# Patient Record
Sex: Female | Born: 1935 | Race: White | Hispanic: No | Smoking: Never smoker
Health system: Southern US, Community
[De-identification: ages and names within clinical notes are randomized; demographics above are authoritative.]

## PROBLEM LIST (undated history)

## (undated) DIAGNOSIS — I10 Essential (primary) hypertension: Secondary | ICD-10-CM

## (undated) DIAGNOSIS — I5022 Chronic systolic (congestive) heart failure: Secondary | ICD-10-CM

## (undated) HISTORY — PX: OTHER SURGICAL HISTORY: SHX169

---

## 2007-02-08 ENCOUNTER — Inpatient Hospital Stay: Payer: Self-pay | Admitting: Unknown Physician Specialty

## 2007-02-08 ENCOUNTER — Other Ambulatory Visit: Payer: Self-pay

## 2018-07-02 ENCOUNTER — Ambulatory Visit: Payer: Self-pay | Admitting: Family Medicine

## 2019-06-27 ENCOUNTER — Inpatient Hospital Stay
Admission: EM | Admit: 2019-06-27 | Discharge: 2019-06-30 | DRG: 291 | Disposition: A | Discharge status: Home Health | Payer: Medicare Other | Attending: Internal Medicine | Admitting: Internal Medicine

## 2019-06-27 ENCOUNTER — Other Ambulatory Visit: Payer: Self-pay

## 2019-06-27 ENCOUNTER — Emergency Department: Payer: Medicare Other

## 2019-06-27 DIAGNOSIS — J849 Interstitial pulmonary disease, unspecified: Secondary | ICD-10-CM | POA: Diagnosis present

## 2019-06-27 DIAGNOSIS — I5042 Chronic combined systolic (congestive) and diastolic (congestive) heart failure: Secondary | ICD-10-CM

## 2019-06-27 DIAGNOSIS — R0602 Shortness of breath: Secondary | ICD-10-CM | POA: Diagnosis not present

## 2019-06-27 DIAGNOSIS — I248 Other forms of acute ischemic heart disease: Secondary | ICD-10-CM | POA: Diagnosis present

## 2019-06-27 DIAGNOSIS — Z20828 Contact with and (suspected) exposure to other viral communicable diseases: Secondary | ICD-10-CM | POA: Diagnosis present

## 2019-06-27 DIAGNOSIS — Z7982 Long term (current) use of aspirin: Secondary | ICD-10-CM

## 2019-06-27 DIAGNOSIS — J441 Chronic obstructive pulmonary disease with (acute) exacerbation: Secondary | ICD-10-CM | POA: Diagnosis present

## 2019-06-27 DIAGNOSIS — J9601 Acute respiratory failure with hypoxia: Secondary | ICD-10-CM | POA: Diagnosis present

## 2019-06-27 DIAGNOSIS — R739 Hyperglycemia, unspecified: Secondary | ICD-10-CM | POA: Diagnosis not present

## 2019-06-27 DIAGNOSIS — Z66 Do not resuscitate: Secondary | ICD-10-CM | POA: Diagnosis not present

## 2019-06-27 DIAGNOSIS — I509 Heart failure, unspecified: Secondary | ICD-10-CM

## 2019-06-27 DIAGNOSIS — I5021 Acute systolic (congestive) heart failure: Principal | ICD-10-CM | POA: Diagnosis present

## 2019-06-27 DIAGNOSIS — R0603 Acute respiratory distress: Secondary | ICD-10-CM

## 2019-06-27 LAB — BASIC METABOLIC PANEL
Anion gap: 10 (ref 5–15)
BUN: 17 mg/dL (ref 8–23)
CO2: 27 mmol/L (ref 22–32)
Calcium: 8.9 mg/dL (ref 8.9–10.3)
Chloride: 101 mmol/L (ref 98–111)
Creatinine, Ser: 0.73 mg/dL (ref 0.44–1.00)
GFR calc Af Amer: >60 mL/min (ref >60)
GFR calc non Af Amer: >60 mL/min (ref >60)
Glucose, Bld: 218 mg/dL — ABNORMAL HIGH (ref 70–99)
Potassium: 4.8 mmol/L (ref 3.5–5.1)
Sodium: 138 mmol/L (ref 135–145)

## 2019-06-27 LAB — CBC WITH DIFFERENTIAL/PLATELET
Abs Immature Granulocytes: 0.04 10*3/uL (ref 0.00–0.07)
Basophils Absolute: 0 10*3/uL (ref 0.0–0.1)
Basophils Relative: 0 %
Eosinophils Absolute: 0.1 10*3/uL (ref 0.0–0.5)
Eosinophils Relative: 1 %
HCT: 43.9 % (ref 36.0–46.0)
Hemoglobin: 14.2 g/dL (ref 12.0–15.0)
Immature Granulocytes: 0 %
Lymphocytes Relative: 47 %
Lymphs Abs: 4.6 10*3/uL — ABNORMAL HIGH (ref 0.7–4.0)
MCH: 30.2 pg (ref 26.0–34.0)
MCHC: 32.3 g/dL (ref 30.0–36.0)
MCV: 93.4 fL (ref 80.0–100.0)
Monocytes Absolute: 0.7 10*3/uL (ref 0.1–1.0)
Monocytes Relative: 7 %
Neutro Abs: 4.4 10*3/uL (ref 1.7–7.7)
Neutrophils Relative %: 45 %
Platelets: 231 10*3/uL (ref 150–400)
RBC: 4.7 MIL/uL (ref 3.87–5.11)
RDW: 13.5 % (ref 11.5–15.5)
WBC: 9.8 10*3/uL (ref 4.0–10.5)
nRBC: 0 % (ref 0.0–0.2)

## 2019-06-27 LAB — BLOOD GAS, VENOUS
Acid-Base Excess: 0.2 mmol/L (ref 0.0–2.0)
Bicarbonate: 26.9 mmol/L (ref 20.0–28.0)
O2 Saturation: 82.8 %
Patient temperature: 37
pCO2, Ven: 51 mmHg (ref 44.0–60.0)
pH, Ven: 7.33 (ref 7.250–7.430)
pO2, Ven: 51 mmHg — ABNORMAL HIGH (ref 32.0–45.0)

## 2019-06-27 LAB — SARS CORONAVIRUS 2 BY RT PCR (HOSPITAL ORDER, PERFORMED IN ~~LOC~~ HOSPITAL LAB): SARS Coronavirus 2: NEGATIVE

## 2019-06-27 LAB — TROPONIN I (HIGH SENSITIVITY): Troponin I (High Sensitivity): 36 ng/L — ABNORMAL HIGH (ref <18)

## 2019-06-27 LAB — BRAIN NATRIURETIC PEPTIDE: B Natriuretic Peptide: 1992 pg/mL — ABNORMAL HIGH (ref 0.0–100.0)

## 2019-06-27 MED ORDER — FUROSEMIDE 10 MG/ML IJ SOLN
40.0000 mg | Freq: Two times a day (BID) | INTRAMUSCULAR | Status: DC
  Administered 2019-06-28 – 2019-06-29 (×3): 40 mg via INTRAVENOUS
  Filled 2019-06-27 (×3): qty 4

## 2019-06-27 MED ORDER — ACETAMINOPHEN 325 MG PO TABS: 650.0000 mg | ORAL_TABLET | ORAL | Status: DC | PRN

## 2019-06-27 MED ORDER — SODIUM CHLORIDE 0.9% FLUSH
3.0000 mL | Freq: Two times a day (BID) | INTRAVENOUS | Status: DC
  Administered 2019-06-28 – 2019-06-30 (×6): 3 mL via INTRAVENOUS

## 2019-06-27 MED ORDER — NITROGLYCERIN IN D5W 200-5 MCG/ML-% IV SOLN
0.0000 ug/min | INTRAVENOUS | Status: DC
  Administered 2019-06-27: 11:00:00 5 ug/min via INTRAVENOUS
  Filled 2019-06-27: qty 250

## 2019-06-27 MED ORDER — LEVOFLOXACIN IN D5W 750 MG/150ML IV SOLN
750.0000 mg | Freq: Once | INTRAVENOUS | Status: AC
  Administered 2019-06-27: 12:00:00 750 mg via INTRAVENOUS
  Filled 2019-06-27: qty 150

## 2019-06-27 MED ORDER — ONDANSETRON HCL 4 MG/2ML IJ SOLN: 4.0000 mg | Freq: Four times a day (QID) | INTRAMUSCULAR | Status: DC | PRN

## 2019-06-27 MED ORDER — CRANBERRY 400 MG PO CAPS: 400.0000 mg | ORAL_CAPSULE | Freq: Every day | ORAL | Status: DC

## 2019-06-27 MED ORDER — ASPIRIN EC 81 MG PO TBEC
81.0000 mg | DELAYED_RELEASE_TABLET | Freq: Every day | ORAL | Status: DC
  Administered 2019-06-28 – 2019-06-30 (×3): 81 mg via ORAL
  Filled 2019-06-27 (×3): qty 1

## 2019-06-27 MED ORDER — SODIUM CHLORIDE 0.9 % IV SOLN
500.0000 mg | INTRAVENOUS | Status: DC
  Administered 2019-06-28: 09:00:00 500 mg via INTRAVENOUS
  Filled 2019-06-27 (×2): qty 500

## 2019-06-27 MED ORDER — ENOXAPARIN SODIUM 40 MG/0.4ML ~~LOC~~ SOLN
30.0000 mg | SUBCUTANEOUS | Status: DC
  Administered 2019-06-28 – 2019-06-29 (×2): 30 mg via SUBCUTANEOUS
  Filled 2019-06-27 (×2): qty 0.4

## 2019-06-27 MED ORDER — SODIUM CHLORIDE 0.9 % IV SOLN
250.0000 mL | INTRAVENOUS | Status: DC | PRN
  Administered 2019-06-29: 10:00:00 250 mL via INTRAVENOUS

## 2019-06-27 MED ORDER — SODIUM CHLORIDE 0.9% FLUSH: 3.0000 mL | INTRAVENOUS | Status: DC | PRN

## 2019-06-27 MED ORDER — FUROSEMIDE 10 MG/ML IJ SOLN
40.0000 mg | Freq: Once | INTRAMUSCULAR | Status: AC
  Administered 2019-06-27: 11:00:00 40 mg via INTRAVENOUS
  Filled 2019-06-27: qty 4

## 2019-06-27 MED ORDER — ADULT MULTIVITAMIN W/MINERALS CH
1.0000 | ORAL_TABLET | Freq: Every day | ORAL | Status: DC
  Administered 2019-06-28 – 2019-06-30 (×3): 1 via ORAL
  Filled 2019-06-27 (×3): qty 1

## 2019-06-27 MED ORDER — SODIUM CHLORIDE 0.9 % IV SOLN
1.0000 g | INTRAVENOUS | Status: DC
  Administered 2019-06-28 – 2019-06-29 (×2): 1 g via INTRAVENOUS
  Filled 2019-06-27 (×2): qty 1

## 2019-06-27 MED ORDER — CHLORHEXIDINE GLUCONATE CLOTH 2 % EX PADS: 6.0000 | MEDICATED_PAD | Freq: Every day | CUTANEOUS | Status: DC

## 2019-06-27 MED ORDER — LOSARTAN POTASSIUM 25 MG PO TABS
25.0000 mg | ORAL_TABLET | Freq: Every day | ORAL | Status: DC
  Administered 2019-06-28 – 2019-06-30 (×3): 25 mg via ORAL
  Filled 2019-06-27 (×3): qty 1

## 2019-06-27 NOTE — ED Notes (Signed)
Swabbed pt's mouth with glycerin swab

## 2019-06-27 NOTE — H&P (Addendum)
[facility] [place] [facility]   PATIENT NAME: Patricia Bradford    MR#:  [id number]  DATE OF BIRTH:  [birth year]  DATE OF ADMISSION:  06/27/2019  PRIMARY CARE PHYSICIAN: System, Pcp Not In   REQUESTING/REFERRING PHYSICIAN: Daryel November  CHIEF COMPLAINT:   Chief Complaint  Patient presents with  . Respiratory Distress    HISTORY OF PRESENT ILLNESS:  Patricia Bradford  is a 85 y.o. female with a known history of no significant past medical history who was brought into the emergency room with complaints of shortness of breath and patient was in respiratory distress.  Patient reported to have received flu shot about 2 days prior.  Arrived in the ED in respiratory distress and was placed on BiPAP due to patient being tachypneic with bilateral rales on exam.  She also was given topical nitroglycerin in route due to elevated blood pressure.  Patient denied any chest pain.  Patient has decreased hearing but still able to respond to questions.  No fevers.  No cough.  Patient still requiring BiPAP at this time.  ABG done was venous blood gas with pH of 7.33 with PCO2 of 51.  PO2 of 51 with bicarb of 26.9.  Noted mild elevation in troponin of 36 B-type natriuretic peptide elevated at 1922.  Chest x-ray done revealed diffuse interstitial prominence throughout the lungs, favor chronic interstitial lung disease. Patchy bilateral opacities are concerning for superimposed pneumonia.  Based on clinical presentation patient was diagnosed with new CHF exacerbation rule out pneumonia.  Patient given a dose of levofloxacin in the emergency room.  Medical service called to admit patient for further evaluation and management.  PAST MEDICAL HISTORY:  Unremarkable  PAST SURGICAL HISTORY:  Unremarkable  SOCIAL HISTORY:   Social History   Tobacco Use  . Smoking status: Never Smoker  . Smokeless tobacco: Never Used  Substance Use Topics  . Alcohol use: Never    Frequency: Never     FAMILY HISTORY:  No family history of diabetes mellitus  DRUG ALLERGIES:  No Known Allergies  REVIEW OF SYSTEMS:   Review of Systems  Constitutional: Negative for chills and fever.  HENT: Negative for hearing loss and tinnitus.   Eyes: Negative for blurred vision and double vision.  Respiratory: Positive for shortness of breath. Negative for hemoptysis.   Cardiovascular: Negative for chest pain, palpitations and leg swelling.  Gastrointestinal: Negative for heartburn, nausea and vomiting.  Genitourinary: Negative for dysuria.  Musculoskeletal: Negative for myalgias.  Skin: Negative for itching and rash.  Psychiatric/Behavioral: Negative for depression and hallucinations.    MEDICATIONS AT HOME:   Prior to Admission medications   Medication Sig Start Date End Date Taking? Authorizing Provider  aspirin EC 81 MG tablet Take 81 mg by mouth daily.   Yes [provider]  Cranberry 400 MG CAPS Take 400 mg by mouth daily.   Yes [provider]  Multiple Vitamin (MULTI-VITAMIN) tablet Take 1 tablet by mouth daily.   Yes [provider]      VITAL SIGNS:  Blood pressure 110/65, pulse 97, temperature 98 F (36.7 C), temperature source Axillary, resp. rate (!) 30, height 4\' 10"  (1.473 m), weight 49.9 kg, SpO2 99 %.  PHYSICAL EXAMINATION:  Physical Exam  GENERAL:  85 y.o.-year-old patient lying in the bed with no acute distress.  EYES: Pupils equal, round, reactive to light and accommodation. No scleral icterus. Extraocular muscles intact.  HEENT: Head atraumatic, normocephalic. Oropharynx and nasopharynx clear.  Patient on BiPAP NECK:  Supple, no jugular venous distention. No thyroid enlargement, no tenderness.  LUNGS: Rales bilaterally, no wheezing, rales,rhonchi or crepitation.  Positive for use of accessory muscles of respiration.  CARDIOVASCULAR: S1, S2 normal. No murmurs, rubs, or gallops.  ABDOMEN: Soft, nontender, nondistended. Bowel sounds present.  No organomegaly or mass.  EXTREMITIES: No pedal edema, cyanosis, or clubbing.  NEUROLOGIC: Generalized weakness but moving all extremities with no focal deficit.  Sensation intact. Gait not checked.  PSYCHIATRIC: The patient is alert and oriented x 3.  SKIN: No obvious rash, lesion, or ulcer.   LABORATORY PANEL:   CBC Recent Labs  Lab 06/27/19 1043  WBC 9.8  HGB 14.2  HCT 43.9  PLT 231   ------------------------------------------------------------------------------------------------------------------  Chemistries  Recent Labs  Lab 06/27/19 1043  NA 138  K 4.8  CL 101  CO2 27  GLUCOSE 218*  BUN 17  CREATININE 0.73  CALCIUM 8.9   ------------------------------------------------------------------------------------------------------------------  Cardiac Enzymes No results for input(s): TROPONINI in the last 168 hours. ------------------------------------------------------------------------------------------------------------------  RADIOLOGY:  Dg Chest Port 1 View  Result Date: 06/27/2019 CLINICAL DATA:  Shortness of breath EXAM: PORTABLE CHEST 1 VIEW COMPARISON:  None. FINDINGS: Heart is normal size. Patchy bilateral airspace opacities are noted. This is superimposed on diffuse interstitial prominence. No effusions. No acute bony abnormality. IMPRESSION: Diffuse interstitial prominence throughout the lungs, favor chronic interstitial lung disease. Patchy bilateral opacities are concerning for superimposed pneumonia. Electronically Signed   By: Rolm Baptise M.D.   On: 06/27/2019 11:19      IMPRESSION AND PLAN:  Patient is a 85 year old female with no significant past medical history being admitted with acute respiratory distress secondary to newly diagnosed CHF with acute exacerbation rule out superimposed pneumonia  1.  Acute hypoxic respiratory failure Secondary to newly diagnosed CHF exacerbation. Patient was initially hypoxic and tachypneic requiring BiPAP in the  emergency room. Noted to have elevated B-type natriuretic peptide.  Rales on exam.  Chest x-ray findings most suggestive of CHF but could not rule out superimposed pneumonia. Patient already being diuresed with IV Lasix 40 mg every 12 hourly.  Started on losartan. Would benefit from initiation of beta-blocker prior to discharge from the hospital.  Follow-up on cardiac enzymes. 2D echocardiogram to evaluate cardiac function. Cardiology consult placed. Blood cultures were also drawn and patient given a dose of Levaquin in the emergency room.  Given patient's age we will switch to azithromycin and Rocephin to cover for any possible superimposed community-acquired pneumonia. Wean off BiPAP as tolerated. Mildly elevated troponin at 36.  Likely due to demand ischemia.  Follow-up on repeat troponin level. Covid test negative  DVT prophylaxis; Lovenox.   All the records are reviewed and case discussed with ED provider. No family member present at this time.  I attempted calling patient's daughter listed in the chart Ms Gar Gibbon but no response.  Will update family when available.    CODE STATUS: Full code  TOTAL TIME TAKING CARE OF THIS PATIENT: 62 minutes.    Durrel Mcnee M.D on 06/27/2019 at 2:17 PM  Between 7am to 6pm - Pager - [phone]  After 6pm go to [email or web address] - Proofreader  [facility] [place] [facility]  Office  [phone]  CC: Primary care physician; System, Pcp Not In   Note: This dictation was prepared with Dragon dictation along with smaller phrase technology. Any transcriptional errors that result from this process are unintentional.

## 2019-06-27 NOTE — ED Notes (Signed)
Pt given socks and update given to family

## 2019-06-27 NOTE — ED Notes (Signed)
Pts family given update  

## 2019-06-27 NOTE — ED Notes (Signed)
Pt complaining of tenderness around R wrist. Assessed IV site and noted significant dried blood around and under dressing. Dressing removed and small skin tear noted around IV insertion site. Saline drained from tear when IV flushed. IV discontinued. Site cleaned and dressed with telfa dressing and kerlex bandage. Pt tolerated wound care well.

## 2019-06-27 NOTE — ED Provider Notes (Signed)
[facility] Emergency Department Provider Note       Time seen: ----------------------------------------- 10:56 AM on 06/27/2019 ----------------------------------------- Level V caveat: History/ROS limited by respiratory distress  I have reviewed the triage vital signs and the nursing notes.  HISTORY   Chief Complaint Respiratory Distress    HPI Patricia Bradford is a 85 y.o. female with no known past medical history who presents to the ED for shortness of breath that started this morning.  Patient reportedly received his flu shot 2 days ago.  She arrives in respiratory distress, was placed on CPAP and thought to have rales bilaterally.  She was also given topical nitroglycerin for hypertension and presumed CHF in route.  She cannot give any further review of systems or report.  History reviewed. No pertinent past medical history.  There are no active problems to display for this patient.   History reviewed. No pertinent surgical history.  Allergies Patient has no known allergies.  Social History Social History   Tobacco Use  . Smoking status: Never Smoker  . Smokeless tobacco: Never Used  Substance Use Topics  . Alcohol use: Never    Frequency: Never  . Drug use: Never   Review of Systems Unknown, reported shortness of breath  All systems negative/normal/unremarkable except as stated in the HPI  ____________________________________________   PHYSICAL EXAM:  VITAL SIGNS: ED Triage Vitals  Enc Vitals Group     BP 06/27/19 1048 (!) 174/87     Pulse Rate 06/27/19 1048 (!) 125     Resp 06/27/19 1048 (!) 45     Temp 06/27/19 1048 98 F (36.7 C)     Temp Source 06/27/19 1048 Axillary     SpO2 06/27/19 1048 100 %     Weight 06/27/19 1044 110 lb (49.9 kg)     Height 06/27/19 1044 4\' 10"  (1.473 m)     Head Circumference --      Peak Flow --      Pain Score --      Pain Loc --      Pain Edu? --      Excl. in GC? --    Constitutional:  Alert, moderate to severe distress Eyes: Conjunctivae are normal. Normal extraocular movements. ENT      Head: Normocephalic and atraumatic.      Nose: No congestion/rhinnorhea.      Mouth/Throat: Mucous membranes are moist.      Neck: No stridor. Cardiovascular: Rapid rate, regular rhythm. No murmurs, rubs, or gallops. Respiratory: Tachypnea with rales bilaterally Gastrointestinal: Soft and nontender. Normal bowel sounds Musculoskeletal: Nontender with normal range of motion in extremities. No lower extremity tenderness nor edema. Neurologic:  No gross focal neurologic deficits are appreciated.  Skin:  Skin is warm, dry and intact. No rash noted. Psychiatric: Anxious mood and affect ____________________________________________  EKG: Interpreted by me.  Sinus tachycardia with a rate of 109 bpm, PACs, left bundle branch block, normal QT  ____________________________________________  ED COURSE:  As part of my medical decision making, I reviewed the following data within the electronic MEDICAL RECORD NUMBER History obtained from family if available, nursing notes, old chart and ekg, as well as notes from prior ED visits. Patient presented for respiratory distress, we will assess with labs and imaging as indicated at this time.   Procedures  Patricia Bradford was evaluated in Emergency Department on 06/27/2019 for the symptoms described in the history of present illness. She was evaluated in the context of the  global COVID-19 pandemic, which necessitated consideration that the patient might be at risk for infection with the SARS-CoV-2 virus that causes COVID-19. Institutional protocols and algorithms that pertain to the evaluation of patients at risk for COVID-19 are in a state of rapid change based on information released by regulatory bodies including the CDC and federal and state organizations. These policies and algorithms were followed during the patient's care in the ED.   ____________________________________________   LABS (pertinent positives/negatives)  Labs Reviewed  CBC WITH DIFFERENTIAL/PLATELET - Abnormal; Notable for the following components:      Result Value   Lymphs Abs 4.6 (*)    All other components within normal limits  BASIC METABOLIC PANEL - Abnormal; Notable for the following components:   Glucose, Bld 218 (*)    All other components within normal limits  BRAIN NATRIURETIC PEPTIDE - Abnormal; Notable for the following components:   B Natriuretic Peptide 1,992.0 (*)    All other components within normal limits  BLOOD GAS, VENOUS - Abnormal; Notable for the following components:   pO2, Ven 51.0 (*)    All other components within normal limits  TROPONIN I (HIGH SENSITIVITY) - Abnormal; Notable for the following components:   Troponin I (High Sensitivity) 36 (*)    All other components within normal limits  SARS CORONAVIRUS 2 BY RT PCR (HOSPITAL ORDER, PERFORMED IN [facility])  CULTURE, BLOOD (ROUTINE X 2)  CULTURE, BLOOD (ROUTINE X 2)   CRITICAL CARE Performed by: Ulice Dash   Total critical care time: 30 minutes  Critical care time was exclusive of separately billable procedures and treating other patients.  Critical care was necessary to treat or prevent imminent or life-threatening deterioration.  Critical care was time spent personally by me on the following activities: development of treatment plan with patient and/or surrogate as well as nursing, discussions with consultants, evaluation of patient's response to treatment, examination of patient, obtaining history from patient or surrogate, ordering and performing treatments and interventions, ordering and review of laboratory studies, ordering and review of radiographic studies, pulse oximetry and re-evaluation of patient's condition.  RADIOLOGY Images were viewed by me  Chest x-ray  IMPRESSION:  Diffuse interstitial prominence throughout the  lungs, favor chronic  interstitial lung disease. Patchy bilateral opacities are concerning  for superimposed pneumonia.  ____________________________________________   DIFFERENTIAL DIAGNOSIS   CHF, COPD, COVID-19, pneumonia, MI, PE  FINAL ASSESSMENT AND PLAN  Acute respiratory distress, congestive heart failure   Plan: The patient had presented for acute respiratory distress on CPAP.  We immediately changed over to BiPAP which she could tolerate better.  I did order some IV Lasix as well as a nitroglycerin drip.  Patient's labs are most indicative of congestive heart failure and as far as we can tell this would be new onset.  BNP was elevated and troponin is mildly elevated.  Left bundle branch block on EKG but it is unclear if this is new or old.  Patient's imaging revealed patchy bilateral opacities which to me likely indicate CHF given her clinical scenario.  She was also given Levaquin to cover for infection.  She remains on BiPAP at this time but we will try to wean.  I have discussed with the hospitalist for admission.   Ulice Dash, MD    Note: This note was generated in part or whole with voice recognition software. Voice recognition is usually quite accurate but there are transcription errors that can and very often do occur.  I apologize for any typographical errors that were not detected and corrected.     Earleen Newport, MD 06/27/19 1240

## 2019-06-27 NOTE — ED Triage Notes (Signed)
Pt arrived via ems from home with c/o [facility] that started this am - only change to medical hx was flu shot 2 days ago - pt arrives in respiratory distress on cpap - provider at bedside and RT called

## 2019-06-27 NOTE — ED Notes (Signed)
Per Larene Beach RN- pt family stated they did not want pt admitted- Dr Jimmye [facility] in room to speak with family

## 2019-06-27 NOTE — ED Notes (Signed)
Attempted to call report- was told that the charge nurse had not assigned this pt to a nurse yet

## 2019-06-27 NOTE — ED Notes (Signed)
Pt has on 2" ntg paste and was also given allopurinol 1.25mg  by EMS

## 2019-06-27 NOTE — ED Notes (Signed)
Pt BP remains 97/61

## 2019-06-27 NOTE — Progress Notes (Signed)
PHARMACIST - PHYSICIAN ORDER COMMUNICATION  CONCERNING: P&T Medication Policy on Herbal Medications  DESCRIPTION:  This patient's order for:  CRANBERRY  has been noted.  This product(s) is classified as an "herbal" or natural product. Due to a lack of definitive safety studies or FDA approval, nonstandard manufacturing practices, plus the potential risk of unknown drug-drug interactions while on inpatient medications, the Pharmacy and Therapeutics Committee does not permit the use of "herbal" or natural products of this type within [place].   ACTION TAKEN: The pharmacy department is unable to verify this order at this time.   Please reevaluate patient's clinical condition at discharge and address if the herbal or natural product(s) should be resumed at that time.   

## 2019-06-27 NOTE — Consult Note (Signed)
Name: Patricia Bradford MRN: [id number] DOB: [birth year]    ADMISSION DATE:  06/27/2019 CONSULTATION DATE:  06/27/2019  REFERRING MD :  Dr. Enid Baas  CHIEF COMPLAINT:  Shortness of Breath  BRIEF PATIENT DESCRIPTION:  85 year old female with no notable past medical history, admitted 10/15 for acute hypoxic respiratory failure in the setting of newly diagnosed CHF exacerbation and questionable superimposed pneumonia requiring BiPAP.  SIGNIFICANT EVENTS  10/15>> admission to stepdown  STUDIES:  10/15 -chest x-ray>>Diffuse interstitial prominence throughout the lungs, favor chronic interstitial lung disease. Patchy bilateral opacities are concerning for superimposed pneumonia.  CULTURES: SARS-CoV-2 PCR 10/15>> negative Blood x210/15>> Sputum 10/15>> Strep pneumo urinary antigen 10/15>> Legionella urinary antigen 10/15>>  ANTIBIOTICS: Levofloxacin 10/15 x 1 dose Azithromycin 10/15>> Ceftriaxone 10/15>>  HISTORY OF PRESENT ILLNESS:   Patricia Bradford is a 85 year old female with no notable past medical history, who presented to [facility] ED on 06/27/2019 with complaints of shortness of breath.  Upon presentation she is she was noted to be in respiratory distress with bilateral rales, which she was subsequently placed on BiPAP.  She denied fever, chills, cough, chest pain, or sick contacts.  Initial work-up in the ED reveals BNP 1992, high-sensitivity troponin 36, WBC 9.8, glucose 218.  Her COVID-19 PCR is negative.  Chest x-ray with diffuse interstitial prominence throughout concerning for chronic interstitial lung disease with patchy bilateral bases concerning for superimposed pneumonia.  She was given IV Lasix and Levofloxacin in ED.  She is being admitted to stepdown unit by hospitalist for further work-up and treatment of acute hypoxic respiratory failure in the setting of newly diagnosed CHF exacerbation and questionable superimposed pneumonia requiring BiPAP.  PCCM is consulted for further  management.  PAST MEDICAL HISTORY :   has no past medical history on file.  has no past surgical history on file. Prior to Admission medications   Medication Sig Start Date End Date Taking? Authorizing Provider  aspirin EC 81 MG tablet Take 81 mg by mouth daily.   Yes [provider]  Cranberry 400 MG CAPS Take 400 mg by mouth daily.   Yes [provider]  Multiple Vitamin (MULTI-VITAMIN) tablet Take 1 tablet by mouth daily.   Yes [provider]   No Known Allergies  FAMILY HISTORY:  family history is not on file. SOCIAL HISTORY:  reports that she has never smoked. She has never used smokeless tobacco. She reports that she does not drink alcohol or use drugs.   COVID-19 DISASTER DECLARATION:  FULL CONTACT PHYSICAL EXAMINATION WAS NOT POSSIBLE DUE TO TREATMENT OF COVID-19 AND  CONSERVATION OF PERSONAL PROTECTIVE EQUIPMENT, LIMITED EXAM FINDINGS INCLUDE-  Patient assessed or the symptoms described in the history of present illness.  In the context of the Global COVID-19 pandemic, which necessitated consideration that the patient might be at risk for infection with the SARS-CoV-2 virus that causes COVID-19, Institutional protocols and algorithms that pertain to the evaluation of patients at risk for COVID-19 are in a state of rapid change based on information released by regulatory bodies including the CDC and federal and state organizations. These policies and algorithms were followed during the patient's care while in hospital.  REVIEW OF SYSTEMS:  Positives in BOLD Constitutional: Negative for fever, chills, weight loss, +malaise/fatigue and diaphoresis.  HENT: Negative for +hearing loss, ear pain, nosebleeds, congestion, sore throat, neck pain, tinnitus and ear discharge.   Eyes: Negative for blurred vision, double vision, photophobia, pain, discharge and redness.  Respiratory: Negative for cough, hemoptysis, sputum production, +  shortness of breath,  wheezing and stridor.   Cardiovascular: Negative for chest pain, palpitations, orthopnea, claudication, leg swelling and PND.  Gastrointestinal: Negative for heartburn, nausea, vomiting, abdominal pain, diarrhea, constipation, blood in stool and melena.  Genitourinary: Negative for dysuria, urgency, frequency, hematuria and flank pain.  Musculoskeletal: Negative for myalgias, back pain, joint pain and falls.  Skin: Negative for itching and rash.  Neurological: Negative for dizziness, tingling, tremors, sensory change, speech change, focal weakness, seizures, loss of consciousness, weakness and headaches.  Endo/Heme/Allergies: Negative for environmental allergies and polydipsia. Does not bruise/bleed easily.  SUBJECTIVE:   VITAL SIGNS: Temp:  [98 F (36.7 C)] 98 F (36.7 C) (10/15 1048) Pulse Rate:  [77-125] 79 (10/15 2200) Resp:  [14-45] 14 (10/15 2200) BP: (89-174)/(55-87) 125/66 (10/15 2200) SpO2:  [95 %-100 %] 100 % (10/15 2200) Weight:  [49.9 kg] 49.9 kg (10/15 1044)  PHYSICAL EXAMINATION: General:  Acutely ill appearing female, laying in bed, on BiPAP and tolerating, in no acute distress Neuro:  Awake, A&O x3, follows commands, no focal deficits, speech clear, pupils PERRLA, hard of hearing HEENT:  Atraumatic, normocephalic, neck supple, no JVD Cardiovascular:  Regular rate and rhythm, s1s2, no M/R/G, 2+ pulses Lungs:  Rales bilaterally upon auscultation, BiPAP assisted (tolerating), even, nonlabored Abdomen:  Soft, nontender, nondistended, no guarding or rebound tenderness, BS+ x4 Musculoskeletal:  No deformities, no edema Skin:  Warm and dry. No obvious rashes, lesions, or ulcerations  Recent Labs  Lab 06/27/19 1043  NA 138  K 4.8  CL 101  CO2 27  BUN 17  CREATININE 0.73  GLUCOSE 218*   Recent Labs  Lab 06/27/19 1043  HGB 14.2  HCT 43.9  WBC 9.8  PLT 231   Dg Chest Port 1 View  Result Date: 06/27/2019 CLINICAL DATA:  Shortness of breath EXAM: PORTABLE CHEST  1 VIEW COMPARISON:  None. FINDINGS: Heart is normal size. Patchy bilateral airspace opacities are noted. This is superimposed on diffuse interstitial prominence. No effusions. No acute bony abnormality. IMPRESSION: Diffuse interstitial prominence throughout the lungs, favor chronic interstitial lung disease. Patchy bilateral opacities are concerning for superimposed pneumonia. Electronically Signed   By: Rolm Baptise M.D.   On: 06/27/2019 11:19    ASSESSMENT / PLAN:  Acute hypoxic respiratory failure in the setting of new onset CHF exacerbation and questionable superimposed pneumonia -Supplemental O2 as needed to maintain O2 sats greater than 92% -BiPAP, wean as tolerated -Follow intermittent chest x-ray and ABG as needed -IV Lasix as blood pressure and renal function permits -Continue Rocephin and azithromycin  New onset CHF exacerbation Mildly elevated troponin, likely demand ischemia -Continuous cardiac monitoring -Maintain MAP greater than 65 -IV Lasix as blood pressure and renal function permits -Trend BNP -Echocardiogram pending -Cardiology consulted, appreciate input -Trend troponin  ? Pneumonia -Monitor fever curve -Trend WBCs -Check procalcitonin -Follow cultures as above -Continue antibiotics as above  Hyperglycemia -CBGs -Sliding scale insulin -Follow ICU hypo-hyperglycemia protocol        Disposition: Stepdown Goals of care: Full code VTE prophylaxis: Subcu Lovenox Updates: Updated patient at bedside 06/27/2019.  Pt is currently full code, will need to further address Code Status with pt and family given her advanced age.  Darel Hong, AGACNP-BC [facility] Pager: [phone] Cell: [phone]

## 2019-06-27 NOTE — ED Notes (Signed)
BP down to 89/55- nitro paused- repeat BP 92/61- nitro remains paused

## 2019-06-27 NOTE — ED Notes (Addendum)
ED TO INPATIENT HANDOFF REPORT  ED Nurse Name and Phone #:  Elijah Birk RN [phone]  S Name/Age/Gender Patricia Bradford 85 y.o. female Room/Bed: ED11A/ED11A  Code Status   Code Status: Full Code  Home/SNF/Other Home Patient oriented to: self, place, time and situation Is this baseline? Yes   Triage Complete: Triage complete  Chief Complaint shortness of breath   Triage Note Pt arrived via ems from home with c/o [facility] that started this am - only change to medical hx was flu shot 2 days ago - pt arrives in respiratory distress on cpap - provider at bedside and RT called   Allergies No Known Allergies  Level of Care/Admitting Diagnosis ED Disposition    ED Disposition Condition Comment   Admit  Hospital Area: [facility] [[id number]]  Level of Care: Stepdown [14]  Covid Evaluation: Asymptomatic Screening Protocol (No Symptoms)  Diagnosis: CHF (congestive heart failure) [facility]) [829562]  Admitting Physician: Jama Flavors [[id number]]  Attending Physician: Jama Flavors [[id number]]  Estimated length of stay: past midnight tomorrow  Certification:: I certify this patient will need inpatient services for at least 2 midnights  PT Class (Do Not Modify): Inpatient [101]  PT Acc Code (Do Not Modify): Private [1]       B Medical/Surgery History History reviewed. No pertinent past medical history. History reviewed. No pertinent surgical history.   A IV Location/Drains/Wounds Patient Lines/Drains/Airways Status   Active Line/Drains/Airways    Name:   Placement date:   Placement time:   Site:   Days:   Peripheral IV 06/27/19 Left Wrist   06/27/19    1045    Wrist   less than 1   Peripheral IV 06/27/19 Right Wrist   06/27/19    1104    Wrist   less than 1   Peripheral IV 06/27/19 Left Forearm   06/27/19    1219    Forearm   less than 1          Intake/Output Last 24 hours  Intake/Output Summary (Last 24 hours) at 06/27/2019 2311 Last data filed at 06/27/2019 1551 Gross per  24 hour  Intake 154.18 ml  Output -  Net 154.18 ml    Labs/Imaging Results for orders placed or performed during the hospital encounter of 06/27/19 (from the past 48 hour(s))  Blood gas, venous     Status: Abnormal   Collection Time: 06/27/19 10:42 AM  Result Value Ref Range   pH, Ven 7.33 7.250 - 7.430   pCO2, Ven 51 44.0 - 60.0 mmHg   pO2, Ven 51.0 (H) 32.0 - 45.0 mmHg   Bicarbonate 26.9 20.0 - 28.0 mmol/L   Acid-Base Excess 0.2 0.0 - 2.0 mmol/L   O2 Saturation 82.8 %   Patient temperature 37.0    Collection site VENOUS    Sample type VENOUS     Comment: Performed at [facility], [address] [place], [place] [address]  CBC with Differential     Status: Abnormal   Collection Time: 06/27/19 10:43 AM  Result Value Ref Range   WBC 9.8 4.0 - 10.5 K/uL   RBC 4.70 3.87 - 5.11 MIL/uL   Hemoglobin 14.2 12.0 - 15.0 g/dL   HCT 57.8 46.9 - 62.9 %   MCV 93.4 80.0 - 100.0 fL   MCH 30.2 26.0 - 34.0 pg   MCHC 32.3 30.0 - 36.0 g/dL   RDW 52.8 41.3 - 24.4 %   Platelets 231 150 - 400 K/uL  nRBC 0.0 0.0 - 0.2 %   Neutrophils Relative % 45 %   Neutro Abs 4.4 1.7 - 7.7 K/uL   Lymphocytes Relative 47 %   Lymphs Abs 4.6 (H) 0.7 - 4.0 K/uL   Monocytes Relative 7 %   Monocytes Absolute 0.7 0.1 - 1.0 K/uL   Eosinophils Relative 1 %   Eosinophils Absolute 0.1 0.0 - 0.5 K/uL   Basophils Relative 0 %   Basophils Absolute 0.0 0.0 - 0.1 K/uL   Immature Granulocytes 0 %   Abs Immature Granulocytes 0.04 0.00 - 0.07 K/uL    Comment: Performed at [facility], [address] [place], [place] [address]  Basic metabolic panel     Status: Abnormal   Collection Time: 06/27/19 10:43 AM  Result Value Ref Range   Sodium 138 135 - 145 mmol/L   Potassium 4.8 3.5 - 5.1 mmol/L   Chloride 101 98 - 111 mmol/L   CO2 27 22 - 32 mmol/L   Glucose, Bld 218 (H) 70 - 99 mg/dL   BUN 17 8 - 23 mg/dL   Creatinine, Ser 0.34 0.44 - 1.00 mg/dL   Calcium 8.9 8.9 - 91.7 mg/dL   GFR calc non Af  Amer >60 >60 mL/min   GFR calc Af Amer >60 >60 mL/min   Anion gap 10 5 - 15    Comment: Performed at [facility], [address] [place], [place] [address]  Brain natriuretic peptide     Status: Abnormal   Collection Time: 06/27/19 10:43 AM  Result Value Ref Range   B Natriuretic Peptide 1,992.0 (H) 0.0 - 100.0 pg/mL    Comment: Performed at [facility], [address] [place], [place] [address]  Troponin I (High Sensitivity)     Status: Abnormal   Collection Time: 06/27/19 10:43 AM  Result Value Ref Range   Troponin I (High Sensitivity) 36 (H) <18 ng/L    Comment: (NOTE) Elevated high sensitivity troponin I (hsTnI) values and significant  changes across serial measurements may suggest ACS but many other  chronic and acute conditions are known to elevate hsTnI results.  Refer to the "Links" section for chest pain algorithms and additional  guidance. Performed at [facility], [address] [place], [place] [address]   SARS Coronavirus 2 by RT PCR (hospital order, performed in [facility] hospital lab) Nasopharyngeal Nasopharyngeal Swab     Status: None   Collection Time: 06/27/19 11:20 AM   Specimen: Nasopharyngeal Swab  Result Value Ref Range   SARS Coronavirus 2 NEGATIVE NEGATIVE    Comment: (NOTE) If result is NEGATIVE SARS-CoV-2 target nucleic acids are NOT DETECTED. The SARS-CoV-2 RNA is generally detectable in upper and lower  respiratory specimens during the acute phase of infection. The lowest  concentration of SARS-CoV-2 viral copies this assay can detect is 250  copies / mL. A negative result does not preclude SARS-CoV-2 infection  and should not be used as the sole basis for treatment or other  patient management decisions.  A negative result may occur with  improper specimen collection / handling, submission of specimen other  than nasopharyngeal swab, presence of viral mutation(s) within the  areas targeted by this assay, and  inadequate number of viral copies  (<250 copies / mL). A negative result must be combined with clinical  observations, patient history, and epidemiological information. If result is POSITIVE SARS-CoV-2 target nucleic acids are DETECTED. The SARS-CoV-2 RNA is generally detectable in upper and lower  respiratory specimens dur ing the acute phase of infection.  Positive  results are indicative of active infection with SARS-CoV-2.  Clinical  correlation with patient history and other diagnostic information is  necessary to determine patient infection status.  Positive results do  not rule out bacterial infection or co-infection with other viruses. If result is PRESUMPTIVE POSTIVE SARS-CoV-2 nucleic acids MAY BE PRESENT.   A presumptive positive result was obtained on the submitted specimen  and confirmed on repeat testing.  While 2019 novel coronavirus  (SARS-CoV-2) nucleic acids may be present in the submitted sample  additional confirmatory testing may be necessary for epidemiological  and / or clinical management purposes  to differentiate between  SARS-CoV-2 and other Sarbecovirus currently known to infect humans.  If clinically indicated additional testing with an alternate test  methodology [phone]([id number]) is advised. The SARS-CoV-2 RNA is generally  detectable in upper and lower respiratory sp ecimens during the acute  phase of infection. The expected result is Negative. Fact Sheet for Patients:  BoilerBrush.com.cyhttps://www.fda.gov/media/136312/download Fact Sheet for Healthcare Providers: https://pope.com/https://www.fda.gov/media/136313/download This test is not yet approved or cleared by the [place] FDA and has been authorized for detection and/or diagnosis of SARS-CoV-2 by FDA under an Emergency Use Authorization (EUA).  This EUA will remain in effect (meaning this test can be used) for the duration of the COVID-19 declaration under Section 564(b)(1) of the Act, 21 U.S.C. section 360bbb-3(b)(1), unless the  authorization is terminated or revoked sooner. Performed at [facility], [address] [place], [place] [address]    Dg Chest Port 1 View  Result Date: 06/27/2019 CLINICAL DATA:  Shortness of breath EXAM: PORTABLE CHEST 1 VIEW COMPARISON:  None. FINDINGS: Heart is normal size. Patchy bilateral airspace opacities are noted. This is superimposed on diffuse interstitial prominence. No effusions. No acute bony abnormality. IMPRESSION: Diffuse interstitial prominence throughout the lungs, favor chronic interstitial lung disease. Patchy bilateral opacities are concerning for superimposed pneumonia. Electronically Signed   By: Charlett NoseKevin  Dover M.D.   On: 06/27/2019 11:19    Pending Labs Unresulted Labs (From admission, onward)    Start     Ordered   06/28/19 0500  Basic metabolic panel  Daily,   STAT     06/27/19 1414   06/28/19 0500  CBC  Tomorrow morning,   STAT     06/27/19 1417   06/28/19 0500  Magnesium  Tomorrow morning,   STAT     06/27/19 1417   06/27/19 1129  Blood culture (routine x 2)  BLOOD CULTURE X 2,   STAT     06/27/19 1128          Vitals/Pain Today's Vitals   06/27/19 2045 06/27/19 2100 06/27/19 2200 06/27/19 2215  BP: 116/69 116/76 125/66 128/74  Pulse: 80 95 79 88  Resp: (!) 26 (!) 29 14 (!) 26  Temp:      TempSrc:      SpO2: 100% 98% 100% 100%  Weight:      Height:        Isolation Precautions No active isolations  Medications Medications  nitroGLYCERIN 50 mg in dextrose 5 % 250 mL (0.2 mg/mL) infusion (0 mcg/min Intravenous Stopped 06/27/19 1200)  aspirin EC tablet 81 mg (has no administration in time range)  Cranberry CAPS 400 mg (has no administration in time range)  Multi-Vitamin 1 tablet (has no administration in time range)  sodium chloride flush (NS) 0.9 % injection 3 mL (3 mLs Intravenous Not Given 06/27/19 1941)  sodium chloride flush (NS) 0.9 % injection 3 mL (has no administration in time range)  0.9 %  sodium chloride infusion  (has no administration in time range)  acetaminophen (TYLENOL) tablet 650 mg (has no administration in time range)  ondansetron (ZOFRAN) injection 4 mg (has no administration in time range)  enoxaparin (LOVENOX) injection 30 mg (has no administration in time range)  losartan (COZAAR) tablet 25 mg (has no administration in time range)  furosemide (LASIX) injection 40 mg (has no administration in time range)  azithromycin (ZITHROMAX) 500 mg in sodium chloride 0.9 % 250 mL IVPB (has no administration in time range)  cefTRIAXone (ROCEPHIN) 1 g in sodium chloride 0.9 % 100 mL IVPB (has no administration in time range)  furosemide (LASIX) injection 40 mg (40 mg Intravenous Given 06/27/19 1105)  levofloxacin (LEVAQUIN) IVPB 750 mg (0 mg Intravenous Stopped 06/27/19 1551)    Mobility walks with device Moderate fall risk   Focused Assessments Pulmonary Assessment Handoff:  Lung sounds: L Breath Sounds: Rales R Breath Sounds: Rales O2 Device: Bi-PAP        R Recommendations: See Admitting Provider Note  Report given to:  Ann RN on ICU  Additional Notes:  Pt on bipap. Infiltrated IV on R wrist.

## 2019-06-27 NOTE — Progress Notes (Signed)
Care Alignment Note  Advanced Directives Documents (Living Will, Power of Attorney) currently in the EHR no advanced directives documents available .  Has the patient discussed their wishes with their family/healthcare power of attorney no.  What does the patient/decision maker understand about their medical condition and the natural course of their disease.  Newly diagnosed CHF.  Acute hypoxic respiratory failure.  Pneumonia.  No family member present at this time.  Patient wishes to be full code for now.  What is the patient/decision maker's biggest fear or concern for the future pain and suffering   What is the most important goal for this patient should their health condition worsen longevity.  Current   Code Status: Full Code  Current code status has been reviewed/updated.  Time spent:17 minutes

## 2019-06-27 NOTE — ED Notes (Signed)
Pt laid back and lights turned off for pt to take a nap

## 2019-06-28 ENCOUNTER — Inpatient Hospital Stay (HOSPITAL_COMMUNITY)
Admit: 2019-06-28 | Discharge: 2019-06-28 | Disposition: A | Discharge status: Home / Self Care | Payer: Medicare Other | Attending: Internal Medicine | Admitting: Internal Medicine

## 2019-06-28 DIAGNOSIS — R0602 Shortness of breath: Secondary | ICD-10-CM

## 2019-06-28 LAB — CBC
HCT: 39.2 % (ref 36.0–46.0)
Hemoglobin: 12.6 g/dL (ref 12.0–15.0)
MCH: 29.9 pg (ref 26.0–34.0)
MCHC: 32.1 g/dL (ref 30.0–36.0)
MCV: 93.1 fL (ref 80.0–100.0)
Platelets: 176 10*3/uL (ref 150–400)
RBC: 4.21 MIL/uL (ref 3.87–5.11)
RDW: 13.3 % (ref 11.5–15.5)
WBC: 7.8 10*3/uL (ref 4.0–10.5)
nRBC: 0 % (ref 0.0–0.2)

## 2019-06-28 LAB — HEMOGLOBIN A1C
Hgb A1c MFr Bld: 5.5 % (ref 4.8–5.6)
Mean Plasma Glucose: 111.15 mg/dL

## 2019-06-28 LAB — PROCALCITONIN: Procalcitonin: 0.2 ng/mL

## 2019-06-28 LAB — BASIC METABOLIC PANEL
Anion gap: 11 (ref 5–15)
BUN: 18 mg/dL (ref 8–23)
CO2: 29 mmol/L (ref 22–32)
Calcium: 8.5 mg/dL — ABNORMAL LOW (ref 8.9–10.3)
Chloride: 100 mmol/L (ref 98–111)
Creatinine, Ser: 0.78 mg/dL (ref 0.44–1.00)
GFR calc Af Amer: >60 mL/min (ref >60)
GFR calc non Af Amer: >60 mL/min (ref >60)
Glucose, Bld: 99 mg/dL (ref 70–99)
Potassium: 3.9 mmol/L (ref 3.5–5.1)
Sodium: 140 mmol/L (ref 135–145)

## 2019-06-28 LAB — BLOOD GAS, ARTERIAL
Acid-Base Excess: 4.4 mmol/L — ABNORMAL HIGH (ref 0.0–2.0)
Bicarbonate: 29.2 mmol/L — ABNORMAL HIGH (ref 20.0–28.0)
FIO2: 0.28
O2 Saturation: 99.1 %
Patient temperature: 37
pCO2 arterial: 43 mmHg (ref 32.0–48.0)
pH, Arterial: 7.44 (ref 7.350–7.450)
pO2, Arterial: 131 mmHg — ABNORMAL HIGH (ref 83.0–108.0)

## 2019-06-28 LAB — GLUCOSE, CAPILLARY
Glucose-Capillary: 90 mg/dL (ref 70–99)
Glucose-Capillary: 92 mg/dL (ref 70–99)
Glucose-Capillary: 88 mg/dL (ref 70–99)
Glucose-Capillary: 108 mg/dL — ABNORMAL HIGH (ref 70–99)
Glucose-Capillary: 89 mg/dL (ref 70–99)
Glucose-Capillary: 112 mg/dL — ABNORMAL HIGH (ref 70–99)

## 2019-06-28 LAB — ECHOCARDIOGRAM COMPLETE
Height: 60 in
Weight: 1527.35 oz

## 2019-06-28 LAB — BRAIN NATRIURETIC PEPTIDE: B Natriuretic Peptide: 1286 pg/mL — ABNORMAL HIGH (ref 0.0–100.0)

## 2019-06-28 LAB — MRSA PCR SCREENING: MRSA by PCR: NEGATIVE

## 2019-06-28 LAB — STREP PNEUMONIAE URINARY ANTIGEN: Strep Pneumo Urinary Antigen: NEGATIVE

## 2019-06-28 LAB — MAGNESIUM: Magnesium: 2 mg/dL (ref 1.7–2.4)

## 2019-06-28 LAB — TROPONIN I (HIGH SENSITIVITY)
Troponin I (High Sensitivity): 167 ng/L (ref <18)
Troponin I (High Sensitivity): 132 ng/L (ref <18)
Troponin I (High Sensitivity): 115 ng/L

## 2019-06-28 MED ORDER — ORAL CARE MOUTH RINSE
15.0000 mL | Freq: Two times a day (BID) | OROMUCOSAL | Status: DC
  Administered 2019-06-28 – 2019-06-30 (×5): 15 mL via OROMUCOSAL

## 2019-06-28 MED ORDER — ENSURE ENLIVE PO LIQD
237.0000 mL | Freq: Two times a day (BID) | ORAL | Status: DC
  Administered 2019-06-28 – 2019-06-30 (×4): 237 mL via ORAL

## 2019-06-28 MED ORDER — CHLORHEXIDINE GLUCONATE 0.12 % MT SOLN
15.0000 mL | Freq: Two times a day (BID) | OROMUCOSAL | Status: DC
  Administered 2019-06-28 – 2019-06-30 (×4): 15 mL via OROMUCOSAL
  Filled 2019-06-28 (×4): qty 15

## 2019-06-28 MED ORDER — INSULIN ASPART 100 UNIT/ML ~~LOC~~ SOLN
0.0000 [IU] | SUBCUTANEOUS | Status: DC
  Administered 2019-06-29 (×2): 2 [IU] via SUBCUTANEOUS
  Filled 2019-06-28 (×2): qty 1

## 2019-06-28 NOTE — Progress Notes (Signed)
Patient alert confused to time. No complaints of pain or shortness of breath. On 2 liters of oxygen. Pure-wick intact. SLP to evaluate patients swallowing and recommendations for diet. Daughter updated.

## 2019-06-28 NOTE — Progress Notes (Signed)
eLink Physician-Brief Progress Note Patient Name: Patricia Bradford DOB: 28-Feb-1916 MRN: [id number]   Date of Service  06/28/2019  HPI/Events of Note  Pt admitted with acute respiratory failure secondary to presumed bilateral interstitial pneumonia +/- CHF.  eICU Interventions  New patient evaluation completed. I agree with management plan outlined by  Tana Conch NP        Frederik Pear 06/28/2019, 12:24 AM

## 2019-06-28 NOTE — Progress Notes (Signed)
[employer] - [place] [facility]   PATIENT NAME: Samarah Hogle    MR#:  [id number]  DATE OF BIRTH:  Sep 02, 1916  SUBJECTIVE:  CHIEF COMPLAINT:   Chief Complaint  Patient presents with  . Respiratory Distress   Shortness of breath is better.  Still on oxygen.  REVIEW OF SYSTEMS:    Review of Systems  Constitutional: Positive for malaise/fatigue. Negative for chills and fever.  HENT: Positive for hearing loss. Negative for sore throat.   Eyes: Negative for blurred vision, double vision and pain.  Respiratory: Positive for cough and shortness of breath. Negative for hemoptysis and wheezing.   Cardiovascular: Negative for chest pain, palpitations, orthopnea and leg swelling.  Gastrointestinal: Negative for abdominal pain, constipation, diarrhea, heartburn, nausea and vomiting.  Genitourinary: Negative for dysuria and hematuria.  Musculoskeletal: Negative for back pain and joint pain.  Skin: Negative for rash.  Neurological: Negative for sensory change, speech change, focal weakness and headaches.  Endo/Heme/Allergies: Does not bruise/bleed easily.  Psychiatric/Behavioral: Negative for depression. The patient is not nervous/anxious.     DRUG ALLERGIES:  No Known Allergies  VITALS:  Blood pressure 122/80, pulse 80, temperature 98.3 F (36.8 C), resp. rate (!) 27, height 5' (1.524 m), weight 43.3 kg, SpO2 100 %.  PHYSICAL EXAMINATION:   Physical Exam  GENERAL:  85 y.o.-year-old patient lying in the bed with conversational dyspnea EYES: Pupils equal, round, reactive to light and accommodation. No scleral icterus. Extraocular muscles intact.  HEENT: Head atraumatic, normocephalic. Oropharynx and nasopharynx clear.  NECK:  Supple, no jugular venous distention. No thyroid enlargement, no tenderness.  LUNGS: Bilateral crackles .  CARDIOVASCULAR: S1, S2 normal. No murmurs, rubs, or gallops.  ABDOMEN: Soft, nontender, nondistended. Bowel sounds present. No  organomegaly or mass.  EXTREMITIES: No cyanosis, clubbing or edema b/l.    NEUROLOGIC: Cranial nerves II through XII are intact. No focal Motor or sensory deficits b/l.   PSYCHIATRIC: The patient is alert and oriented x 3.  SKIN: No obvious rash, lesion, or ulcer.   LABORATORY PANEL:   CBC Recent Labs  Lab 06/28/19 0333  WBC 7.8  HGB 12.6  HCT 39.2  PLT 176   ------------------------------------------------------------------------------------------------------------------ Chemistries  Recent Labs  Lab 06/28/19 0333  NA 140  K 3.9  CL 100  CO2 29  GLUCOSE 99  BUN 18  CREATININE 0.78  CALCIUM 8.5*  MG 2.0   ------------------------------------------------------------------------------------------------------------------  Cardiac Enzymes No results for input(s): TROPONINI in the last 168 hours. ------------------------------------------------------------------------------------------------------------------  RADIOLOGY:  Dg Chest Port 1 View  Result Date: 06/27/2019 CLINICAL DATA:  Shortness of breath EXAM: PORTABLE CHEST 1 VIEW COMPARISON:  None. FINDINGS: Heart is normal size. Patchy bilateral airspace opacities are noted. This is superimposed on diffuse interstitial prominence. No effusions. No acute bony abnormality. IMPRESSION: Diffuse interstitial prominence throughout the lungs, favor chronic interstitial lung disease. Patchy bilateral opacities are concerning for superimposed pneumonia. Electronically Signed   By: Charlett Nose M.D.   On: 06/27/2019 11:19     ASSESSMENT AND PLAN:   Patient is a 85 year old female with no significant past medical history being admitted with acute respiratory distress secondary to newly diagnosed CHF with acute exacerbation rule out superimposed pneumonia  * Acute hypoxic respiratory failure secondary to combination of CHF and pneumonia Wean oxygen as tolerated Nebulizers as needed  *New onset congestive heart failure.  Acute.   Echo pending.  Systolic versus diastolic. On IV Lasix and diuresing well. Replace potassium as needed.  Monitor renal  function.  *Bilateral pneumonia.  Continue IV antibiotics. Cultures no growth to date. Covid test negative  DVT prophylaxis; Lovenox.  All the records are reviewed and case discussed with Care Management/Social Worker Management plans discussed with the patient, family and they are in agreement.  CODE STATUS: DNR/DNI  DVT Prophylaxis: SCDs  TOTAL TIME TAKING CARE OF THIS PATIENT: 35 minutes.   POSSIBLE D/C IN 1-2 DAYS, DEPENDING ON CLINICAL CONDITION.  Leia Alf Thyra Yinger M.D on 06/28/2019 at 12:24 PM  Between 7am to 6pm - Pager - [phone]  After 6pm go to [email or web address] - password [email or web address]  Office  [phone]  CC: Primary care physician; System, Pcp Not In  Note: This dictation was prepared with Dragon dictation along with smaller phrase technology. Any transcriptional errors that result from this process are unintentional.

## 2019-06-28 NOTE — Consult Note (Signed)
Name: Patricia Bradford MRN: [id number] DOB: 1916-04-15    ADMISSION DATE:  06/27/2019 CONSULTATION DATE:  06/27/2019  REFERRING MD :  Dr. Stark Jock  CHIEF COMPLAINT:  Shortness of Breath  BRIEF PATIENT DESCRIPTION:  85 year old female with no notable past medical history, admitted 10/15 for acute hypoxic respiratory failure in the setting of newly diagnosed CHF exacerbation and questionable superimposed pneumonia requiring BiPAP.  SIGNIFICANT EVENTS  10/15>> admission to stepdown  STUDIES:  10/15 -chest x-ray>>Diffuse interstitial prominence throughout the lungs, favor chronic interstitial lung disease. Patchy bilateral opacities are concerning for superimposed pneumonia.  CULTURES: SARS-CoV-2 PCR 10/15>> negative Blood x210/15>> Sputum 10/15>> Strep pneumo urinary antigen 10/15>> Legionella urinary antigen 10/15>>  ANTIBIOTICS: Levofloxacin 10/15 x 1 dose Azithromycin 10/15>> Ceftriaxone 10/15>>  HISTORY OF PRESENT ILLNESS:   [name] is a 85 year old female with no notable past medical history, who presented to [facility] ED on 06/27/2019 with complaints of shortness of breath.  Upon presentation she is she was noted to be in respiratory distress with bilateral rales, which she was subsequently placed on BiPAP.  She denied fever, chills, cough, chest pain, or sick contacts.  Initial work-up in the ED reveals BNP 1992, high-sensitivity troponin 36, WBC 9.8, glucose 218.  Her COVID-19 PCR is negative.  Chest x-ray with diffuse interstitial prominence throughout concerning for chronic interstitial lung disease with patchy bilateral bases concerning for superimposed pneumonia.  She was given IV Lasix and Levofloxacin in ED.  She is being admitted to stepdown unit by hospitalist for further work-up and treatment of acute hypoxic respiratory failure in the setting of newly diagnosed CHF exacerbation and questionable superimposed pneumonia requiring BiPAP.  PCCM is consulted for further  management.  PAST MEDICAL HISTORY :   has no past medical history on file.  has no past surgical history on file. Prior to Admission medications   Medication Sig Start Date End Date Taking? Authorizing Provider  aspirin EC 81 MG tablet Take 81 mg by mouth daily.   Yes [provider]  Cranberry 400 MG CAPS Take 400 mg by mouth daily.   Yes [provider]  Multiple Vitamin (MULTI-VITAMIN) tablet Take 1 tablet by mouth daily.   Yes [provider]   No Known Allergies  FAMILY HISTORY:  family history is not on file. SOCIAL HISTORY:  reports that she has never smoked. She has never used smokeless tobacco. She reports that she does not drink alcohol or use drugs.   COVID-19 DISASTER DECLARATION:  FULL CONTACT PHYSICAL EXAMINATION WAS NOT POSSIBLE DUE TO TREATMENT OF COVID-19 AND  CONSERVATION OF PERSONAL PROTECTIVE EQUIPMENT, LIMITED EXAM FINDINGS INCLUDE-  Patient assessed or the symptoms described in the history of present illness.  In the context of the Global COVID-19 pandemic, which necessitated consideration that the patient might be at risk for infection with the SARS-CoV-2 virus that causes COVID-19, Institutional protocols and algorithms that pertain to the evaluation of patients at risk for COVID-19 are in a state of rapid change based on information released by regulatory bodies including the CDC and federal and state organizations. These policies and algorithms were followed during the patient's care while in hospital.  REVIEW OF SYSTEMS:  Positives in BOLD Constitutional: Negative for fever, chills, weight loss, +malaise/fatigue and diaphoresis.  HENT: Negative for +hearing loss, ear pain, nosebleeds, congestion, sore throat, neck pain, tinnitus and ear discharge.   Eyes: Negative for blurred vision, double vision, photophobia, pain, discharge and redness.  Respiratory: Negative for cough, hemoptysis, sputum production, +  shortness of breath,  wheezing and stridor.   Cardiovascular: Negative for chest pain, palpitations, orthopnea, claudication, leg swelling and PND.  Gastrointestinal: Negative for heartburn, nausea, vomiting, abdominal pain, diarrhea, constipation, blood in stool and melena.  Genitourinary: Negative for dysuria, urgency, frequency, hematuria and flank pain.  Musculoskeletal: Negative for myalgias, back pain, joint pain and falls.  Skin: Negative for itching and rash.  Neurological: Negative for dizziness, tingling, tremors, sensory change, speech change, focal weakness, seizures, loss of consciousness, weakness and headaches.  Endo/Heme/Allergies: Negative for environmental allergies and polydipsia. Does not bruise/bleed easily.  SUBJECTIVE:   VITAL SIGNS: Temp:  [97.6 F (36.4 C)-98.3 F (36.8 C)] 98.3 F (36.8 C) (10/16 0700) Pulse Rate:  [64-125] 85 (10/16 0700) Resp:  [14-45] 22 (10/16 0700) BP: (89-174)/(55-107) 136/73 (10/16 0700) SpO2:  [90 %-100 %] 99 % (10/16 0700) FiO2 (%):  [40 %] 40 % (10/16 0200) Weight:  [43.3 kg-49.9 kg] 43.3 kg (10/16 0122)  PHYSICAL EXAMINATION: General:  Acutely ill appearing female, laying in bed, on BiPAP and tolerating, in no acute distress Neuro:  Awake, A&O x3, follows commands, no focal deficits, speech clear, pupils PERRLA, hard of hearing HEENT:  Atraumatic, normocephalic, neck supple, no JVD Cardiovascular:  Regular rate and rhythm, s1s2, no M/R/G, 2+ pulses Lungs:  Rales bilaterally upon auscultation, BiPAP assisted (tolerating), even, nonlabored Abdomen:  Soft, nontender, nondistended, no guarding or rebound tenderness, BS+ x4 Musculoskeletal:  No deformities, no edema Skin:  Warm and dry. No obvious rashes, lesions, or ulcerations  Recent Labs  Lab 06/27/19 1043 06/28/19 0333  NA 138 140  K 4.8 3.9  CL 101 100  CO2 27 29  BUN 17 18  CREATININE 0.73 0.78  GLUCOSE 218* 99   Recent Labs  Lab 06/27/19 1043 06/28/19 0333  HGB 14.2 12.6  HCT 43.9  39.2  WBC 9.8 7.8  PLT 231 176   Dg Chest Port 1 View  Result Date: 06/27/2019 CLINICAL DATA:  Shortness of breath EXAM: PORTABLE CHEST 1 VIEW COMPARISON:  None. FINDINGS: Heart is normal size. Patchy bilateral airspace opacities are noted. This is superimposed on diffuse interstitial prominence. No effusions. No acute bony abnormality. IMPRESSION: Diffuse interstitial prominence throughout the lungs, favor chronic interstitial lung disease. Patchy bilateral opacities are concerning for superimposed pneumonia. Electronically Signed   By: Charlett Nose M.D.   On: 06/27/2019 11:19    ASSESSMENT / PLAN:  Acute hypoxic respiratory failure in the setting of new onset CHF exacerbation and questionable superimposed pneumonia -Supplemental O2 as needed to maintain O2 sats greater than 92% -BiPAP, wean as tolerated -Follow intermittent chest x-ray  -ABG-reviewed today - improved - will optimize for downgrade to medical floor.  -IV Lasix as blood pressure and renal function permits -Continue Rocephin and azithromycin  New onset CHF exacerbation Mildly elevated troponin, likely demand ischemia -Continuous cardiac monitoring -Maintain MAP greater than 65 -IV Lasix as blood pressure and renal function permits -Trend BNP -Echocardiogram pending -Cardiology consulted, appreciate input -Trend troponin  ? Pneumonia -Monitor fever curve -Trend WBCs -Check procalcitonin -Follow cultures as above -Continue antibiotics as above  Hyperglycemia -CBGs -Sliding scale insulin -Follow ICU hypo-hyperglycemia protocol        Disposition: Stepdown Goals of care: Full code VTE prophylaxis: Subcu Lovenox Updates: Updated patient at bedside 06/27/2019.  Pt is currently full code, will need to further address Code Status with pt and family given her advanced age.    Vida Rigger, M.D.  Pulmonary & Critical Care Medicine  [facility]    Critical care provider statement:     Critical care time (minutes):  33   Critical care time was exclusive of:  Separately billable procedures and  treating other patients   Critical care was necessary to treat or prevent imminent or  life-threatening deterioration of the following conditions:  acute hypoxemic hypercapnic respiratory failure, advanced age, multiple comorbid conditions.    Critical care was time spent personally by me on the following  activities:  Development of treatment plan with patient or surrogate,  discussions with consultants, evaluation of patient's response to  treatment, examination of patient, obtaining history from patient or  surrogate, ordering and performing treatments and interventions, ordering  and review of laboratory studies and re-evaluation of patient's condition   I assumed direction of critical care for this patient from another  provider in my specialty: no

## 2019-06-28 NOTE — Progress Notes (Signed)
*  PRELIMINARY RESULTS* Echocardiogram 2D Echocardiogram has been performed.  Patricia Bradford 06/28/2019, 9:46 AM

## 2019-06-28 NOTE — Progress Notes (Signed)
Advance care planning  Purpose of Encounter CHF, pneumonia, CODE STATUS discussion  Parties in Attendance Patient, daughter her healthcare power of attorney at [employer]  Patients Decisional capacity Patient is alert and awake.  Not oriented to time.  Also decreased hearing.  Daughter at bedside helps with decision-making  Discussed in detail regarding CHF, pneumonia.  Treatment plan , prognosis discussed.  All questions answered  Discussed regarding CODE STATUS and patient and daughter mention DNR/DNI status.  Do not want any aggressive procedures.  Orders entered and CODE STATUS changed  DNR/DNI  Time spent - 17 minutes

## 2019-06-28 NOTE — Evaluation (Addendum)
Clinical/Bedside Swallow Evaluation Patient Details  Name: Patricia Bradford MRN: [id number] Date of Birth: 24-Sep-1915  Today's Date: 06/28/2019 Time: SLP Start Time (ACUTE ONLY): 0930 SLP Stop Time (ACUTE ONLY): 1030 SLP Time Calculation (min) (ACUTE ONLY): 60 min  Past Medical History: History reviewed. No pertinent past medical history. Past Surgical History: History reviewed. No pertinent surgical history. HPI:  Pt is a 85 y.o. female with a known history of no significant past medical history except for old Bell's Palsy(affecting Left side of face/eye) per pt/Dtr and an old, "mild" stroke in [place] per Dtr "years ago which bothered her speech a little but she stopped seeing the Speech therapist" who was brought into the emergency room with complaints of shortness of breath and patient was in respiratory distress.  Patient reported to have received Flu Shot about 2 days prior.  Arrived in the ED in respiratory distress and was placed on BiPAP due to patient being tachypneic with bilateral rales on exam.  She also was given topical nitroglycerin in route due to elevated blood pressure.  Patient denied any chest pain.  Patient has decreased hearing but still able to respond to questions.  No fevers.  No cough.  Patient still requiring BiPAP at this time.  ABG done was venous blood gas with pH of 7.33 with PCO2 of 51.  PO2 of 51 with bicarb of 26.9.  Noted mild elevation in troponin of 36 (demand ischemia).  Chest x-ray done revealed diffuse interstitial prominence throughout the lungs, favor chronic interstitial lung disease. Patchy bilateral opacities are concerning for superimposed pneumonia.  Based on clinical presentation, patient was diagnosed with new CHF exacerbation.    Assessment / Plan / Recommendation Clinical Impression  Pt appears to present w/ adequate oropharyngeal phase swallow function w/ NO overt, clinical s/s of aspiration noted during po trials presented. Pt and Dtr stated pt has  not eaten/drunk much in past few days d/t not feeling well post receiving a Flu Shot (possible dehydration). Pt's PMH is minimal but she/Dtr reported a "mild" stroke "years ago" w/ min impact on her speech - she discontinued seeing a Speech therapist after few sessions. Pt also has baseline Bell's Palsy affecting the tone of the Left side of her face/eye -- she and Dtr deny any swallowing problems from either the stroke or the Bell's Palsy. Noted pt's current weight/bmi and No active cardiopulmonary disease until this admission. Pt and Dtr endorse pt has "belching" at meals which could indicate min Esophageal dysmotility.  During po trials, pt exhibited full engagement and attention to task feeding self w/ setup support. She consumed trials of ice chips, thin liquids via cup, and purees/soft solids w/ no overt, clinical s/s of aspiration noted; no decline in vocal quality or respiratory effort during/post trials(O2 sats remained 98%). Bolus management during the oral phase was appropriate w/ timely mastication then A-P transfer for swallowing. Oral clearing complete. OM exam appeared [facility] for lingual strength/rom; min Left facial/labial decreased tone noted -- no anterior spillage occurred during po trials. Pt fed self.  Recommend initiation of a mech soft diet for conservation of energy and ease of mastication(pt is missing few molars); thin liquids via cup; aspiration and reflux precautions; setup support at meals; Pills Whole in Puree for safer swallowing.  SLP Visit Diagnosis: Dysphagia, unspecified (R13.10)    Aspiration Risk  (reduced risk for aspiration)    Diet Recommendation  Mech Soft diet (cut meats for conservation of energy, moistened foods); thin liquids via Cup. General  aspiration and Reflux precautions. Setup support at meals.  Medication Administration: Whole meds with puree(for safer swallowing)    Other  Recommendations Recommended Consults: (Dietician f/u) Oral Care Recommendations:  Oral care BID;Staff/trained caregiver to provide oral care Other Recommendations: (n/a)   Follow up Recommendations None, as pt appears at her baseline     Frequency and Duration (n/a)  (n/a)       Prognosis Prognosis for Safe Diet Advancement: Good Barriers to Reach Goals: (n/a)      Swallow Study   General Date of Onset: 06/27/19 HPI: Pt is a 85 y.o. female with a known history of no significant past medical history except for old Bell's Palsy(affecting Left side of face/eye) per pt/Dtr and an old, "mild" stroke in [place] per Dtr "years ago which bothered her speech a little but she stopped seeing the Speech therapist" who was brought into the emergency room with complaints of shortness of breath and patient was in respiratory distress.  Patient reported to have received Flu Shot about 2 days prior.  Arrived in the ED in respiratory distress and was placed on BiPAP due to patient being tachypneic with bilateral rales on exam.  She also was given topical nitroglycerin in route due to elevated blood pressure.  Patient denied any chest pain.  Patient has decreased hearing but still able to respond to questions.  No fevers.  No cough.  Patient still requiring BiPAP at this time.  ABG done was venous blood gas with pH of 7.33 with PCO2 of 51.  PO2 of 51 with bicarb of 26.9.  Noted mild elevation in troponin of 36 (demand ischemia).  Chest x-ray done revealed diffuse interstitial prominence throughout the lungs, favor chronic interstitial lung disease. Patchy bilateral opacities are concerning for superimposed pneumonia.  Based on clinical presentation, patient was diagnosed with new CHF exacerbation.  Type of Study: Bedside Swallow Evaluation Previous Swallow Assessment: none Diet Prior to this Study: Regular;Thin liquids Temperature Spikes Noted: No(wbc 7.8) Respiratory Status: Nasal cannula(2L) History of Recent Intubation: No Behavior/Cognition: Alert;Cooperative;Pleasant mood(HOH) Oral  Cavity Assessment: Dry Oral Care Completed by SLP: Yes Oral Cavity - Dentition: Adequate natural dentition(missing just few) Vision: Functional for self-feeding Self-Feeding Abilities: Able to feed self;Needs set up Patient Positioning: Upright in bed(assist w/ postioning) Baseline Vocal Quality: Normal Volitional Cough: Strong Volitional Swallow: Able to elicit    Oral/Motor/Sensory Function Overall Oral Motor/Sensory Function: Mild impairment(Left side face -- old Bell's Palsy dx per pt/Dtr) Facial ROM: Reduced left(min) Facial Symmetry: Abnormal symmetry left(min) Facial Strength: Reduced left(min) Lingual ROM: Within Functional Limits Lingual Symmetry: Within Functional Limits Lingual Strength: Within Functional Limits Velum: Within Functional Limits Mandible: Within Functional Limits   Ice Chips Ice chips: Within functional limits Presentation: Spoon;Self Fed(4 trials)   Thin Liquid Thin Liquid: Within functional limits Presentation: Cup;Self Fed(~4+ ozs total) Other Comments: water, coffee    Nectar Thick Nectar Thick Liquid: Not tested   Honey Thick Honey Thick Liquid: Not tested   Puree Puree: Within functional limits Presentation: Self Fed;Spoon(4 ozs )   Solid     Solid: Within functional limits Presentation: Self Fed(graham cracker dipped in puree; 6 trials)        Orinda Kenner, MS, CCC-SLP [place] 06/28/2019,1:58 PM

## 2019-06-29 ENCOUNTER — Other Ambulatory Visit: Payer: Self-pay

## 2019-06-29 LAB — CBC WITH DIFFERENTIAL/PLATELET
Abs Immature Granulocytes: 0.02 10*3/uL (ref 0.00–0.07)
Basophils Absolute: 0 10*3/uL (ref 0.0–0.1)
Basophils Relative: 0 %
Eosinophils Absolute: 0.1 10*3/uL (ref 0.0–0.5)
Eosinophils Relative: 1 %
HCT: 37.8 % (ref 36.0–46.0)
Hemoglobin: 12.5 g/dL (ref 12.0–15.0)
Immature Granulocytes: 0 %
Lymphocytes Relative: 21 %
Lymphs Abs: 1.2 10*3/uL (ref 0.7–4.0)
MCH: 30.1 pg (ref 26.0–34.0)
MCHC: 33.1 g/dL (ref 30.0–36.0)
MCV: 91.1 fL (ref 80.0–100.0)
Monocytes Absolute: 0.6 10*3/uL (ref 0.1–1.0)
Monocytes Relative: 11 %
Neutro Abs: 3.8 10*3/uL (ref 1.7–7.7)
Neutrophils Relative %: 67 %
Platelets: 175 10*3/uL (ref 150–400)
RBC: 4.15 MIL/uL (ref 3.87–5.11)
RDW: 13.2 % (ref 11.5–15.5)
WBC: 5.6 10*3/uL (ref 4.0–10.5)
nRBC: 0 % (ref 0.0–0.2)

## 2019-06-29 LAB — GLUCOSE, CAPILLARY
Glucose-Capillary: 115 mg/dL — ABNORMAL HIGH (ref 70–99)
Glucose-Capillary: 159 mg/dL — ABNORMAL HIGH (ref 70–99)
Glucose-Capillary: 161 mg/dL — ABNORMAL HIGH (ref 70–99)
Glucose-Capillary: 195 mg/dL — ABNORMAL HIGH (ref 70–99)
Glucose-Capillary: 92 mg/dL (ref 70–99)
Glucose-Capillary: 93 mg/dL (ref 70–99)
Glucose-Capillary: 98 mg/dL (ref 70–99)

## 2019-06-29 LAB — BASIC METABOLIC PANEL
Anion gap: 11 (ref 5–15)
BUN: 22 mg/dL (ref 8–23)
CO2: 30 mmol/L (ref 22–32)
Calcium: 8.4 mg/dL — ABNORMAL LOW (ref 8.9–10.3)
Chloride: 99 mmol/L (ref 98–111)
Creatinine, Ser: 0.72 mg/dL (ref 0.44–1.00)
GFR calc Af Amer: >60 mL/min (ref >60)
GFR calc non Af Amer: >60 mL/min (ref >60)
Glucose, Bld: 100 mg/dL — ABNORMAL HIGH (ref 70–99)
Potassium: 3.3 mmol/L — ABNORMAL LOW (ref 3.5–5.1)
Sodium: 140 mmol/L (ref 135–145)

## 2019-06-29 LAB — TROPONIN I (HIGH SENSITIVITY): Troponin I (High Sensitivity): 86 ng/L — ABNORMAL HIGH (ref <18)

## 2019-06-29 MED ORDER — FUROSEMIDE 10 MG/ML IJ SOLN: 20.0000 mg | Freq: Two times a day (BID) | INTRAMUSCULAR | Status: DC

## 2019-06-29 MED ORDER — CARVEDILOL 3.125 MG PO TABS
3.1250 mg | ORAL_TABLET | Freq: Two times a day (BID) | ORAL | Status: DC
  Administered 2019-06-29 – 2019-06-30 (×2): 3.125 mg via ORAL
  Filled 2019-06-29 (×2): qty 1

## 2019-06-29 NOTE — Progress Notes (Signed)
Physical Therapy Evaluation Patient Details Name: Patricia Bradford MRN: [id number] DOB: [birth year] Today's Date: 06/29/2019   History of Present Illness  Patricia Bradford  is a 84 y.o. female with a known history of no significant past medical history who was brought into the emergency room with complaints of shortness of breath and patient was in respiratory distress  Clinical Impression  Patient is able to perform bed mobility with min assist, transfers sit to stand with min assist with RW. She ambulated 65 feet with min assist and RW.  She has BLE weakness 3/5 and has fatigue after mobility training. She will continue to benefit from skilled PT to improve safety, mobility and strength.     Follow Up Recommendations Home health PT    Equipment Recommendations  Rolling walker with 5" wheels    Recommendations for Other Services       Precautions / Restrictions Restrictions Weight Bearing Restrictions: No      Mobility  Bed Mobility Overal bed mobility: Needs Assistance Bed Mobility: Supine to Sit;Sit to Supine     Supine to sit: Min assist Sit to supine: Min assist      Transfers Overall transfer level: Needs assistance Equipment used: Rolling walker (2 wheeled)             General transfer comment: needs TC  Ambulation/Gait Ambulation/Gait assistance: Min assist Gait Distance (Feet): (65) Assistive device: Rolling walker (2 wheeled) Gait Pattern/deviations: Step-to pattern     General Gait Details: (decreased gait speed)  Stairs            Wheelchair Mobility    Modified Rankin (Stroke Patients Only)       Balance Overall balance assessment: Mild deficits observed, not formally tested                                           Pertinent Vitals/Pain Pain Assessment: No/denies pain    Home Living Family/patient expects to be discharged to:: Private residence Living Arrangements: Children Available Help at Discharge: Family                   Prior Function Level of Independence: (unknown)               Hand Dominance        Extremity/Trunk Assessment   Upper Extremity Assessment Upper Extremity Assessment: Overall WFL for tasks assessed    Lower Extremity Assessment Lower Extremity Assessment: Generalized weakness       Communication   Communication: HOH  Cognition Arousal/Alertness: Awake/alert Behavior During Therapy: WFL for tasks assessed/performed Overall Cognitive Status: Difficult to assess                                 General Comments: (hard to communicate due to [facility])      General Comments      Exercises     Assessment/Plan    PT Assessment Patient needs continued PT services  PT Problem List Decreased strength;Decreased activity tolerance;Decreased coordination       PT Treatment Interventions Gait training;Therapeutic activities;Therapeutic exercise    PT Goals (Current goals can be found in the Care Plan section)  Acute Rehab PT Goals Patient Stated Goal: no goals stated PT Goal Formulation: Patient unable to participate in goal setting Time For Goal Achievement: 07/13/19 Potential  to Achieve Goals: Good    Frequency Min 2X/week   Barriers to discharge        Co-evaluation               AM-PAC PT "6 Clicks" Mobility  Outcome Measure Help needed turning from your back to your side while in a flat bed without using bedrails?: A Little Help needed moving from lying on your back to sitting on the side of a flat bed without using bedrails?: A Little Help needed moving to and from a bed to a chair (including a wheelchair)?: A Little Help needed standing up from a chair using your arms (e.g., wheelchair or bedside chair)?: A Little Help needed to walk in hospital room?: A Little Help needed climbing 3-5 steps with a railing? : A Little 6 Click Score: 18    End of Session Equipment Utilized During Treatment: Gait belt;Other  (comment) Activity Tolerance: Patient limited by fatigue Patient left: with bed alarm set   PT Visit Diagnosis: Muscle weakness (generalized) (M62.81);Apraxia (R48.2)    Time: 5035-4656 PT Time Calculation (min) (ACUTE ONLY): 25 min   Charges:   PT Evaluation $PT Eval Low Complexity: 1 Low PT Treatments $Gait Training: 8-22 mins          Alanson Puls, PT DPT 06/29/2019, 2:16 PM

## 2019-06-29 NOTE — Progress Notes (Signed)
[facility] [place] at [facility]   PATIENT NAME: Patricia Bradford    MR#:  [id number]  DATE OF BIRTH:  1916/01/20  SUBJECTIVE:  Hard on hearing dter at bedside Shortness of breath is better.  sats 95% on RA No fever  REVIEW OF SYSTEMS:    Review of Systems  Constitutional: Positive for malaise/fatigue. Negative for chills and fever.  HENT: Positive for hearing loss. Negative for sore throat.   Eyes: Negative for blurred vision, double vision and pain.  Respiratory: Positive for cough and shortness of breath. Negative for hemoptysis and wheezing.   Cardiovascular: Negative for chest pain, palpitations, orthopnea and leg swelling.  Gastrointestinal: Negative for abdominal pain, constipation, diarrhea, heartburn, nausea and vomiting.  Genitourinary: Negative for dysuria and hematuria.  Musculoskeletal: Negative for back pain and joint pain.  Skin: Negative for rash.  Neurological: Negative for sensory change, speech change, focal weakness and headaches.  Endo/Heme/Allergies: Does not bruise/bleed easily.  Psychiatric/Behavioral: Negative for depression. The patient is not nervous/anxious.     DRUG ALLERGIES:  No Known Allergies  VITALS:  Blood pressure 114/70, pulse 72, temperature (!) 97.2 F (36.2 C), resp. rate 18, height 5' (1.524 m), weight 44.9 kg, SpO2 95 %.  PHYSICAL EXAMINATION:   Physical Exam  GENERAL:  85 y.o.-year-old patient lying in the bed no acute distress EYES: Pupils equal, round, reactive to light and accommodation. No scleral icterus. Extraocular muscles intact.  HEENT: Head atraumatic, normocephalic. Oropharynx and nasopharynx clear.  NECK:  Supple, no jugular venous distention. No thyroid enlargement, no tenderness.  LUNGS:normal BS CARDIOVASCULAR: S1, S2 normal. No murmurs, rubs, or gallops.  ABDOMEN: Soft, nontender, nondistended. Bowel sounds present. No organomegaly or mass.  EXTREMITIES: No cyanosis, clubbing or edema b/l.     NEUROLOGIC: Cranial nerves II through XII are intact. No focal Motor or sensory deficits b/l.   PSYCHIATRIC: The patient is alert and oriented x 3.  SKIN: No obvious rash, lesion, or ulcer.   LABORATORY PANEL:   CBC Recent Labs  Lab 06/29/19 0613  WBC 5.6  HGB 12.5  HCT 37.8  PLT 175   ------------------------------------------------------------------------------------------------------------------ Chemistries  Recent Labs  Lab 06/28/19 0333 06/29/19 0613  NA 140 140  K 3.9 3.3*  CL 100 99  CO2 29 30  GLUCOSE 99 100*  BUN 18 22  CREATININE 0.78 0.72  CALCIUM 8.5* 8.4*  MG 2.0  --    ------------------------------------------------------------------------------------------------------------------  Cardiac Enzymes No results for input(s): TROPONINI in the last 168 hours. ------------------------------------------------------------------------------------------------------------------  RADIOLOGY:  No results found.   ASSESSMENT AND PLAN:   Patient is a 85 year old female with no significant past medical history being admitted with acute respiratory distress secondary to newly diagnosed CHF with acute exacerbation rule out superimposed pneumonia  * Acute hypoxic respiratory failure secondary to combination of new onset systolic CHF -clinically no infection -Pro calcitonin negative , no fever or cough and WBC normal Wean oxygen as tolerated--now on RA Nebulizers as needed  *New onset congestive heart failure.  Acute.  EchoEF 20-25%   On IV Lasix and diuresing well.--change to oral in am Replace potassium as needed.  Monitor renal function. -add coreg and cont losartan -dter does not want any aggressive w/u or cardiology consult Covid test negative  *DVT prophylaxis; Lovenox.  PT to see pt  D/c likely in am All the records are reviewed and case discussed with Care Management/Social Worker   CODE STATUS: DNR/DNI  DVT Prophylaxis: SCDs  TOTAL TIME  TAKING  CARE OF THIS PATIENT: 25 minutes.   POSSIBLE D/C IN 1-2 DAYS, DEPENDING ON CLINICAL CONDITION.  Fritzi Mandes M.D on 06/29/2019 at 2:39 PM  Between 7am to 6pm - Pager - [phone]  After 6pm go to [email or web address] - password [email or web address] [name]  Office  [phone]  CC: Primary care physician; System, Pcp Not In  Note: This dictation was prepared with Dragon dictation along with smaller phrase technology. Any transcriptional errors that result from this process are unintentional.

## 2019-06-30 LAB — BASIC METABOLIC PANEL
Anion gap: 11 (ref 5–15)
BUN: 34 mg/dL — ABNORMAL HIGH (ref 8–23)
CO2: 31 mmol/L (ref 22–32)
Calcium: 8.7 mg/dL — ABNORMAL LOW (ref 8.9–10.3)
Chloride: 99 mmol/L (ref 98–111)
Creatinine, Ser: 0.7 mg/dL (ref 0.44–1.00)
GFR calc Af Amer: >60 mL/min (ref >60)
GFR calc non Af Amer: >60 mL/min (ref >60)
Glucose, Bld: 86 mg/dL (ref 70–99)
Potassium: 3.7 mmol/L (ref 3.5–5.1)
Sodium: 141 mmol/L (ref 135–145)

## 2019-06-30 LAB — GLUCOSE, CAPILLARY
Glucose-Capillary: 81 mg/dL (ref 70–99)
Glucose-Capillary: 101 mg/dL — ABNORMAL HIGH (ref 70–99)

## 2019-06-30 LAB — LEGIONELLA PNEUMOPHILA SEROGP 1 UR AG: L. pneumophila Serogp 1 Ur Ag: NEGATIVE

## 2019-06-30 MED ORDER — CARVEDILOL 3.125 MG PO TABS: 3.1250 mg | ORAL_TABLET | Freq: Two times a day (BID) | ORAL | 1 refills | Status: DC

## 2019-06-30 MED ORDER — ENSURE ENLIVE PO LIQD: 237.0000 mL | Freq: Two times a day (BID) | ORAL | 12 refills | Status: DC

## 2019-06-30 MED ORDER — FUROSEMIDE 20 MG PO TABS: 20.0000 mg | ORAL_TABLET | ORAL | 0 refills | Status: DC

## 2019-06-30 MED ORDER — LOSARTAN POTASSIUM 25 MG PO TABS: 25.0000 mg | ORAL_TABLET | Freq: Every day | ORAL | 1 refills | Status: DC

## 2019-06-30 MED ORDER — FUROSEMIDE 20 MG PO TABS
20.0000 mg | ORAL_TABLET | ORAL | Status: DC
  Administered 2019-06-30: 10:00:00 20 mg via ORAL
  Filled 2019-06-30: qty 1

## 2019-06-30 NOTE — TOC Transition Note (Signed)
Transition of Care [facility]) - CM/SW Discharge Note   Patient Details  Name: Patricia Bradford MRN: [id number] Date of Birth: 09-24-1915  [facility] of [facility] CM/SW Contact:  Latanya Maudlin, RN Phone Number: 06/30/2019, 9:08 AM   Clinical Narrative:  Patient to be discharged per MD order. Orders in place for home health services. Met with patient and daughter, [name] to discuss disposition. [name] assists with care for patient but patient is able to complete most activities of daily living with minimal assistance. Patient uses a walker or cane at baseline. They are agreeable to home health. CMS Medicare.gov Compare Post Acute Care list reviewed with patient and they have no preference of agency. Referral placed with [place] [facility] [place] care. Patient has all needed DME. Daughter to transport. Daughter also interested in outpatient palliative services so I have faxed referral per her request to [place].       Final next level of care: Home w Home Health Services Barriers to Discharge: No Barriers Identified   Patient Goals and CMS Choice   CMS Medicare.gov Compare Post Acute Care list provided to:: Patient Choice offered to / list presented to : Patient  Discharge Placement                       Discharge Plan and Services     Post Acute Care Choice: Home Health                    HH Arranged: RN, PT [facility] Agency: [place] ([place]) Date [facility] Agency Contacted: 06/30/19 Time [place]: 0908 Representative spoke with at [place]: [place] ([place]) Interventions     Readmission Risk Interventions Readmission Risk Prevention Plan 06/30/2019  Post Dischage Appt Complete  Medication Screening Complete  Transportation Screening Complete  Some recent data might be hidden

## 2019-06-30 NOTE — Discharge Instructions (Signed)
[facility] Registered Nurse and Physical Therapy services.

## 2019-06-30 NOTE — Progress Notes (Signed)
MD order received in [facility] to discharge pt home with home health today; Care Management previously established Home Health RN and PT services with [facility]; verbally reviewed AVS with pt and her daughter, Dalbert Batman; Rxs escribed to [facility] on [address] in [place], [place]; gave out of facility, DNR to pt; no questions voiced at this time; pt discharged by nursing to the visitor's entrance

## 2019-06-30 NOTE — Plan of Care (Signed)

## 2019-06-30 NOTE — Discharge Summary (Signed)
[place] [facility] [place] NAME: Patricia Bradford    MR#:  [id number]  DATE OF BIRTH:  July 08, 1916  DATE OF ADMISSION:  06/27/2019 ADMITTING PHYSICIAN: Otila Back, MD  DATE OF DISCHARGE: 06/30/2019  PRIMARY CARE PHYSICIAN: Casilda Carls, MD    ADMISSION DIAGNOSIS:  Respiratory distress [X54.00] Acute systolic congestive heart failure ([place]) [I50.21]  DISCHARGE DIAGNOSIS:  Acute Systolic CHF  SECONDARY DIAGNOSIS:  History reviewed. No pertinent past medical history.  HOSPITAL COURSE:  Patient isa8 year old female with no significant past medical history being admitted with acute respiratory distress secondary to newly diagnosed CHF with acute exacerbation rule out superimposed pneumonia  *Acute hypoxic respiratory failure secondary to combination of new onset systolic CHF -clinically no infection -Pro calcitonin negative , no fever or cough and WBC normal Wean oxygen as tolerated--now on RA Nebulizers as needed  *New onset congestive heart failure.  Acute.  EchoEF 20-25%   On IV Lasix and diuresing well.--change to oral in am 20 mg lasix qod Replace potassium as needed.  Monitor renal function. -tolerating coreg and cont losartan -dter does not want any aggressive w/u or cardiology consult -Covid test negative  *DVT prophylaxis;Lovenox.  PT input noted HHPT with RW  Overall stable. D/c home with CHF RN and HHPT at home dter will call and make appt with Dr Rosario Jacks   CONSULTS OBTAINED:  Treatment Team:  Hillary Bow, MD  DRUG ALLERGIES:  No Known Allergies  DISCHARGE MEDICATIONS:   Allergies as of 06/30/2019   No Known Allergies     Medication List    TAKE these medications   aspirin EC 81 MG tablet Take 81 mg by mouth daily.   carvedilol 3.125 MG tablet Commonly known as: COREG Take 1 tablet (3.125 mg total) by mouth 2 (two) times daily with a meal.   Cranberry 400 MG Caps Take 400 mg by mouth  daily.   feeding supplement (ENSURE ENLIVE) Liqd Take 237 mLs by mouth 2 (two) times daily between meals.   furosemide 20 MG tablet Commonly known as: LASIX Take 1 tablet (20 mg total) by mouth every other day.   losartan 25 MG tablet Commonly known as: COZAAR Take 1 tablet (25 mg total) by mouth daily.   Multi-Vitamin tablet Take 1 tablet by mouth daily.            Durable Medical Equipment  (From admission, onward)         Start     Ordered   06/30/19 0810  For home use only DME Walker rolling  Once    Question:  Patient needs a walker to treat with the following condition  Answer:  General weakness   06/30/19 0809          If you experience worsening of your admission symptoms, develop shortness of breath, life threatening emergency, suicidal or homicidal thoughts you must seek medical attention immediately by calling 911 or calling your MD immediately  if symptoms less severe.  You Must read complete instructions/literature along with all the possible adverse reactions/side effects for all the Medicines you take and that have been prescribed to you. Take any new Medicines after you have completely understood and accept all the possible adverse reactions/side effects.   Please note  You were cared for by a hospitalist during your hospital stay. If you have any questions about your discharge medications or the care you received while you were in the hospital after you are discharged, you  can call the unit and asked to speak with the hospitalist on call if the hospitalist that took care of you is not available. Once you are discharged, your primary care physician will handle any further medical issues. Please note that NO REFILLS for any discharge medications will be authorized once you are discharged, as it is imperative that you return to your primary care physician (or establish a relationship with a primary care physician if you do not have one) for your aftercare needs  so that they can reassess your need for medications and monitor your lab values. Today   SUBJECTIVE   No new complaints  VITAL SIGNS:  Blood pressure 120/62, pulse 83, temperature (!) 97.2 F (36.2 C), temperature source Oral, resp. rate 18, height 5' (1.524 m), weight 44.5 kg, SpO2 93 %.  I/O:    Intake/Output Summary (Last 24 hours) at 06/30/2019 0810 Last data filed at 06/30/2019 0007 Gross per 24 hour  Intake 109.87 ml  Output 800 ml  Net -690.13 ml    PHYSICAL EXAMINATION:  GENERAL:  [age] y.o.-year-old patient lying in the bed with no acute distress.  EYES: Pupils equal, round, reactive to light and accommodation. No scleral icterus. Extraocular muscles intact.  HEENT: Head atraumatic, normocephalic. Oropharynx and nasopharynx clear.  NECK:  Supple, no jugular venous distention. No thyroid enlargement, no tenderness.  LUNGS: Normal breath sounds bilaterally, no wheezing, rales,rhonchi or crepitation. No use of accessory muscles of respiration.  CARDIOVASCULAR: S1, S2 normal. No murmurs, rubs, or gallops.  ABDOMEN: Soft, non-tender, non-distended. Bowel sounds present. No organomegaly or mass.  EXTREMITIES: No pedal edema, cyanosis, or clubbing.  NEUROLOGIC: Cranial nerves II through XII are intact. Muscle strength 5/5 in all extremities. Sensation intact. Gait not checked.  PSYCHIATRIC: The patient is alert and oriented x 3.  SKIN: No obvious rash, lesion, or ulcer.   DATA REVIEW:   CBC  Recent Labs  Lab 06/29/19 0613  WBC 5.6  HGB 12.5  HCT 37.8  PLT 175    Chemistries  Recent Labs  Lab 06/28/19 0333  06/30/19 0407  NA 140   < > 141  K 3.9   < > 3.7  CL 100   < > 99  CO2 29   < > 31  GLUCOSE 99   < > 86  BUN 18   < > 34*  CREATININE 0.78   < > 0.70  CALCIUM 8.5*   < > 8.7*  MG 2.0  --   --    < > = values in this interval not displayed.    Microbiology Results   Recent Results (from the past 240 hour(s))  SARS Coronavirus 2 by RT PCR (hospital  order, performed in [facility] hospital lab) Nasopharyngeal Nasopharyngeal Swab     Status: None   Collection Time: 06/27/19 11:20 AM   Specimen: Nasopharyngeal Swab  Result Value Ref Range Status   SARS Coronavirus 2 NEGATIVE NEGATIVE Final    Comment: (NOTE) If result is NEGATIVE SARS-CoV-2 target nucleic acids are NOT DETECTED. The SARS-CoV-2 RNA is generally detectable in upper and lower  respiratory specimens during the acute phase of infection. The lowest  concentration of SARS-CoV-2 viral copies this assay can detect is 250  copies / mL. A negative result does not preclude SARS-CoV-2 infection  and should not be used as the sole basis for treatment or other  patient management decisions.  A negative result may occur with  improper specimen collection / handling, submission  of specimen other  than nasopharyngeal swab, presence of viral mutation(s) within the  areas targeted by this assay, and inadequate number of viral copies  (<250 copies / mL). A negative result must be combined with clinical  observations, patient history, and epidemiological information. If result is POSITIVE SARS-CoV-2 target nucleic acids are DETECTED. The SARS-CoV-2 RNA is generally detectable in upper and lower  respiratory specimens dur ing the acute phase of infection.  Positive  results are indicative of active infection with SARS-CoV-2.  Clinical  correlation with patient history and other diagnostic information is  necessary to determine patient infection status.  Positive results do  not rule out bacterial infection or co-infection with other viruses. If result is PRESUMPTIVE POSTIVE SARS-CoV-2 nucleic acids MAY BE PRESENT.   A presumptive positive result was obtained on the submitted specimen  and confirmed on repeat testing.  While 2019 novel coronavirus  (SARS-CoV-2) nucleic acids may be present in the submitted sample  additional confirmatory testing may be necessary for epidemiological  and  / or clinical management purposes  to differentiate between  SARS-CoV-2 and other Sarbecovirus currently known to infect humans.  If clinically indicated additional testing with an alternate test  methodology [phone]) is advised. The SARS-CoV-2 RNA is generally  detectable in upper and lower respiratory sp ecimens during the acute  phase of infection. The expected result is Negative. Fact Sheet for Patients:  BoilerBrush.com.cy Fact Sheet for Healthcare Providers: https://pope.com/ This test is not yet approved or cleared by the [place] FDA and has been authorized for detection and/or diagnosis of SARS-CoV-2 by FDA under an Emergency Use Authorization (EUA).  This EUA will remain in effect (meaning this test can be used) for the duration of the COVID-19 declaration under Section 564(b)(1) of the Act, 21 U.S.C. section 360bbb-3(b)(1), unless the authorization is terminated or revoked sooner. Performed at [facility], [address] [place], [place] [address]   Blood culture (routine x 2)     Status: None (Preliminary result)   Collection Time: 06/27/19 11:42 AM   Specimen: BLOOD  Result Value Ref Range Status   Specimen Description BLOOD RIGHT FA  Final   Special Requests   Final    BOTTLES DRAWN AEROBIC AND ANAEROBIC Blood Culture adequate volume   Culture   Final    NO GROWTH 3 DAYS Performed at [facility], [address] [place], [place] [address]    Report Status PENDING  Incomplete  Blood culture (routine x 2)     Status: None (Preliminary result)   Collection Time: 06/27/19 11:42 AM   Specimen: BLOOD  Result Value Ref Range Status   Specimen Description BLOOD LEFT WRIST  Final   Special Requests   Final    BOTTLES DRAWN AEROBIC AND ANAEROBIC Blood Culture adequate volume   Culture   Final    NO GROWTH 3 DAYS Performed at [facility], [address] [place], [place] [address]     Report Status PENDING  Incomplete  MRSA PCR Screening     Status: None   Collection Time: 06/27/19 11:59 PM   Specimen: Nasopharyngeal  Result Value Ref Range Status   MRSA by PCR NEGATIVE NEGATIVE Final    Comment:        The GeneXpert MRSA Assay (FDA approved for NASAL specimens only), is one component of a comprehensive MRSA colonization surveillance program. It is not intended to diagnose MRSA infection nor to guide or monitor treatment for MRSA infections. Performed  at [facility], [address] [place], [place] [address]     RADIOLOGY:  No results found.   CODE STATUS:     Code Status Orders  (From admission, onward)         Start     Ordered   06/28/19 1222  Do not attempt resuscitation (DNR)  Continuous    Question Answer Comment  In the event of cardiac or respiratory ARREST Do not call a "code blue"   In the event of cardiac or respiratory ARREST Do not perform Intubation, CPR, defibrillation or ACLS   In the event of cardiac or respiratory ARREST Use medication by any route, position, wound care, and other measures to relive pain and suffering. May use oxygen, suction and manual treatment of airway obstruction as needed for comfort.      06/28/19 1221        Code Status History    Date Active Date Inactive Code Status Order ID Comments User Context   06/27/2019 1414 06/28/2019 1221 Full Code [id number]  Jama Flavors, MD ED   Advance Care Planning Activity      TOTAL TIME TAKING CARE OF THIS PATIENT: *40* minutes.    Enedina Finner M.D on 06/30/2019 at 8:10 AM  Between 7am to 6pm - Pager - [phone] After 6pm go to [email or web address] - password [email or web address]  [facility]  Office  [phone]  CC: Primary care physician; Sherrie Mustache, MD

## 2019-07-02 LAB — CULTURE, BLOOD (ROUTINE X 2)
Culture: NO GROWTH
Culture: NO GROWTH
Special Requests: ADEQUATE
Special Requests: ADEQUATE

## 2019-07-11 ENCOUNTER — Ambulatory Visit: Payer: Medicare Other | Admitting: Family

## 2019-07-15 ENCOUNTER — Other Ambulatory Visit: Payer: Self-pay

## 2019-07-15 ENCOUNTER — Telehealth: Payer: Self-pay | Admitting: Adult Health Nurse Practitioner

## 2019-07-15 ENCOUNTER — Other Ambulatory Visit: Payer: Medicare Other | Admitting: Adult Health Nurse Practitioner

## 2019-07-15 DIAGNOSIS — Z515 Encounter for palliative care: Secondary | ICD-10-CM

## 2019-07-15 NOTE — Progress Notes (Signed)
[facility] Consult Note Telephone: [phone]  Fax: [phone]  PATIENT NAME: Patricia Bradford DOB: 1916-07-07 MRN: [id number]  PRIMARY CARE PROVIDER:   Casilda Carls, MD  REFERRING PROVIDER:  Casilda Carls, MD [place],  [place] [address]  RESPONSIBLE PARTY:  Dalbert Batman, daughter C: [phone] Patient's home # [phone]   Due to the COVID-19 crisis, this visit was done via telemedicine and it was initiated and consent by this patient and or family. Video-audio (telehealth) contact was unable to be done due to technical barriers from the patient's side.    RECOMMENDATIONS and PLAN:  1.  Advanced care planning.  Patient is a DNR/DNI.  Has DNR form in the home.  Briefly went over MOST form and has agreed to have Hard Choices book and MOST form sent.  Have appointment in one month to go over further.  2.  CHF.  Patient was in hospital 10/15-10/18/2020 for SOB and was found to have new onset CHF.  Echo showed EF of 20-25%.  Patient was started on lasix 20 mg QOD, losartan 25 mg daily, and coreg 3.125 mg BID.  Daughter states that she was taken off the coreg due to it causing diarrhea.  States that she has not been taking lasix QOD; just giving it as needed.  Weighs daily and stays 92-94 pounds.  Has parameters on when to give lasix.  Denies any SOB, cough, chest pain.  Checks VS every morning and they have been WNL.  Patient ambulates with a walker and is independent of ADLs. Prior to hospitalization she was using a cane. Is able to do some light house work but does get tired sooner than she did prior to her hospitalization.  Has not had PT/OT services;daughter states that she gets stronger every day.  Continue daily weights and monitoring for edema and increased SOB.  Keep all follow up appointments and follow recommendations.    3.  Mild stroke.  Daughter states that when she was in [place] earlier this year that she had a mild  stroke that has affected her speech.  States that she at times has problems saying what she wants to say but once she calms down she is able to say it.  Denies any cognitive issues.  Does state that it may take her a while to remember something but she usually will recall what she is trying to remember.  She did not get a work up at hospital when she had suspected stroke due to patient refusing to go.    Overall patient doing well at this time.  Daughter states that she used to weigh 111 pounds but slowly dropped down to where she is now at around 92-94 pounds.  She has been at this weight for over a year.  Her appetite is good.  No reported falls or infections.  Encouraged to call with any changes in condition or questions.  I spent 30 minutes providing this consultation,  from 4:00 to 4:30. More than 50% of the time in this consultation was spent coordinating communication.   HISTORY OF PRESENT ILLNESS:  Patricia Bradford is a 85 y.o. year old female with multiple medical problems including new onset CHF, h/o kidney stones. Palliative Care was asked to help address goals of care.   CODE STATUS: DNR/DNI  PPS: 60% HOSPICE ELIGIBILITY/DIAGNOSIS: TBD  PHYSICAL EXAM:   Deferred   PAST MEDICAL HISTORY: No past medical history on file.  SOCIAL HX:  Social History   Tobacco Use  . Smoking status: Never Smoker  . Smokeless tobacco: Never Used  Substance Use Topics  . Alcohol use: Never    Frequency: Never    ALLERGIES: No Known Allergies   PERTINENT MEDICATIONS:  Outpatient Encounter Medications as of 07/15/2019  Medication Sig  . aspirin EC 81 MG tablet Take 81 mg by mouth daily.  . carvedilol (COREG) 3.125 MG tablet Take 1 tablet (3.125 mg total) by mouth 2 (two) times daily with a meal.  . Cranberry 400 MG CAPS Take 400 mg by mouth daily.  . feeding supplement, ENSURE ENLIVE, (ENSURE ENLIVE) LIQD Take 237 mLs by mouth 2 (two) times daily between meals.  . furosemide (LASIX) 20 MG  tablet Take 1 tablet (20 mg total) by mouth every other day.  . losartan (COZAAR) 25 MG tablet Take 1 tablet (25 mg total) by mouth daily.  . Multiple Vitamin (MULTI-VITAMIN) tablet Take 1 tablet by mouth daily.   No facility-administered encounter medications on file as of 07/15/2019.       Emilyanne Mcgough Marlena Clipper, NP

## 2019-07-15 NOTE — Telephone Encounter (Signed)
Called patient to schedule Palliative consult and spoke with daughter [name], she was in agreement with this.  I have scheduled a Telephone Consult for 07/15/19 2 4  PM

## 2019-08-12 ENCOUNTER — Other Ambulatory Visit: Payer: Self-pay

## 2019-08-12 ENCOUNTER — Telehealth: Payer: Self-pay | Admitting: Adult Health Nurse Practitioner

## 2019-08-12 ENCOUNTER — Other Ambulatory Visit: Payer: Medicare Other | Admitting: Adult Health Nurse Practitioner

## 2019-08-12 NOTE — Telephone Encounter (Signed)
Called for scheduled telephone visit.  No answer on home or cell phone.  No VM box to leave message at either phone number.  Will put on list for reschedule Patricia Bradford K. Olena Heckle NP

## 2019-10-24 ENCOUNTER — Telehealth: Payer: Self-pay | Admitting: Adult Health Nurse Practitioner

## 2019-10-24 NOTE — Telephone Encounter (Signed)
Called daughter's home # to schedule a Palliative f/u visit, no answer - left message with reason for call along with my contact number.  Also called daughter's cell and patient's home #, no answer and no voicemail.

## 2019-10-24 NOTE — Telephone Encounter (Signed)
Rec'd call back from daughter, Alvino Chapel and she stated that patient is doing really well and that she was in [place] staying with her other daughter for the winter.  Alvino Chapel stated that pt should be back home around April.  I told daughter that we would call back and f/u with her at that time to see if we needed to schedule a f/u visit

## 2019-12-26 ENCOUNTER — Telehealth: Payer: Self-pay | Admitting: Adult Health Nurse Practitioner

## 2019-12-26 NOTE — Telephone Encounter (Signed)
Spoke with patient's daughter Alvino Chapel to see if patient had come home from [place] yet, so that we could schedule a Palliative f/u visit.  Daughter said that patient is still there and should possibly be coming home the first week in May.  Daughter stated that as far as she knows patient is doing okay.  Will f/u with daughter in about 3 weeks.

## 2020-01-23 ENCOUNTER — Telehealth: Payer: Self-pay | Admitting: Adult Health Nurse Practitioner

## 2020-01-23 NOTE — Telephone Encounter (Signed)
Called daughter's cell number to see if patient had returned home from [place] yet, so that we could schedule a Palliative f/u visit with the patient - no answer and unable to leave message due to no voicemail set up.  I then called daughter's home # with no answer - left message with my name and contact number requesting a call back.

## 2020-03-16 ENCOUNTER — Encounter: Payer: Self-pay | Admitting: Nurse Practitioner

## 2020-03-16 ENCOUNTER — Other Ambulatory Visit: Payer: Self-pay

## 2020-03-16 ENCOUNTER — Other Ambulatory Visit: Payer: Medicare Other | Admitting: Nurse Practitioner

## 2020-03-16 DIAGNOSIS — Z515 Encounter for palliative care: Secondary | ICD-10-CM

## 2020-03-16 DIAGNOSIS — I509 Heart failure, unspecified: Secondary | ICD-10-CM

## 2020-03-16 NOTE — Progress Notes (Signed)
Therapist, nutritional Palliative Care Consult Note Telephone: [phone]  Fax: [phone]  PATIENT NAME: Patricia Bradford DOB: Feb 26, 1916 MRN: [id number]  PRIMARY CARE PROVIDER:   Sherrie Mustache, MD  REFERRING PROVIDER:  Sherrie Mustache, MD [address],  [place] [address]  RESPONSIBLE PARTY:   Daughter, Patricia. Scherrie Gerlach, daughter C: [phone] Patient's home # [phone]  Due to the COVID-19 crisis, this visit was done via telemedicine from my office and it was initiated and consent by this patient and or family.   RECOMMENDATIONS and PLAN:  1. ACP: Patient is a DNR/DNI.  Has DNR form in the home.  MOST form being reviewed by family   2. Palliative care encounter; Palliative medicine team will continue to support patient, patient's family, and medical team. Visit consisted of counseling and education dealing with the complex and emotionally intense issues of symptom management and palliative care in the setting of serious and potentially life-threatening illness I spent 50 minutes providing this consultation,  from 1:15pm to 2:05pm. More than 50% of the time in this consultation was spent coordinating communication.   HISTORY OF PRESENT ILLNESS:  Patricia Bradford is a 86 y.o. year old female with multiple medical problems including CHF, h/o kidney stones. I called Patricia Bradford daughter, Patricia Bradford for follow-up palliative care telemedicine telephonic. We talked about how Patricia. Bradford has been doing. Patricia Bradford endorses that she actually is doing well. She is no longer walking with her walker as she has transition to a cane. No recent Falls. No recent hospitalizations, wounds come infections. Patricia Bradford endorses Patricia Bradford does get herself dressed in the morning, bathe their self in addition to washing her own hair. Patricia Bradford endorses she feeds herself and is able to make herself a meal. Patricia Bradford endorses she did ride to [place] with family over the winter by car  and came back by plane tolerated all very well. We talked about Patricia Bradford appetite which was good. We talked about no noted weight loss. We talked about overall Patricia Bradford is doing very well, no decline. We talked about medical goals of care, chronic disease, progression of natural aging. We talked about role of palliative care and plan of care. Talked about follow-up visit and 8 weeks if needed or sooner should she declined. Patricia Bradford in agreement appointment scheduled. Questions answered dissatisfaction. Contact information provided. Therapeutic listening, emotional support provided.  Palliative Care was asked to help to continue to address goals of care.   CODE STATUS: DNR  PPS: 50% HOSPICE ELIGIBILITY/DIAGNOSIS: TBD  PAST MEDICAL HISTORY: History reviewed. No pertinent past medical history.  SOCIAL HX:  Social History   Tobacco Use  . Smoking status: Never Smoker  . Smokeless tobacco: Never Used  Substance Use Topics  . Alcohol use: Never    ALLERGIES: No Known Allergies   PERTINENT MEDICATIONS:  Outpatient Encounter Medications as of 03/16/2020  Medication Sig  . aspirin EC 81 MG tablet Take 81 mg by mouth daily.  . carvedilol (COREG) 3.125 MG tablet Take 1 tablet (3.125 mg total) by mouth 2 (two) times daily with a meal.  . Cranberry 400 MG CAPS Take 400 mg by mouth daily.  . feeding supplement, ENSURE ENLIVE, (ENSURE ENLIVE) LIQD Take 237 mLs by mouth 2 (two) times daily between meals.  . furosemide (LASIX) 20 MG tablet Take 1 tablet (20 mg total) by mouth every other day.  . losartan (COZAAR) 25 MG tablet Take 1 tablet (  25 mg total) by mouth daily.  . Multiple Vitamin (MULTI-VITAMIN) tablet Take 1 tablet by mouth daily.   No facility-administered encounter medications on file as of 03/16/2020.    PHYSICAL EXAM:   Deferred [name] Z Aiman Noe, NP

## 2020-05-04 ENCOUNTER — Encounter: Payer: Self-pay | Admitting: Nurse Practitioner

## 2020-05-04 ENCOUNTER — Other Ambulatory Visit: Payer: Self-pay

## 2020-05-04 ENCOUNTER — Other Ambulatory Visit: Payer: Medicare Other | Admitting: Nurse Practitioner

## 2020-05-04 DIAGNOSIS — Z515 Encounter for palliative care: Secondary | ICD-10-CM

## 2020-05-04 DIAGNOSIS — I509 Heart failure, unspecified: Secondary | ICD-10-CM

## 2020-05-04 NOTE — Progress Notes (Signed)
Therapist, nutritional Palliative Care Consult Note Telephone: [phone]  Fax: [phone]  PATIENT NAME: Patricia Bradford DOB: [birth year] MRN: [id number]  PRIMARY CARE PROVIDER:   Sherrie Mustache, MD  REFERRING PROVIDER:  Sherrie Mustache, MD [address] [place],  [place] [address]  RESPONSIBLE PARTY:  Patricia Bradford, daughter C: [phone] Patient's home # [phone]  RECOMMENDATIONS and PLAN:  1. ACP: DNR/DNI placed in Vyn[facility]   2. Palliative care encounter; Palliative medicine team will continue to support patient, patient's family, and medical team. Visit consisted of counseling and education dealing with the complex and emotionally intense issues of symptom management and palliative care in the setting of serious and potentially life-threatening illness  3. F/u in 3 months or sooner if declines for ongoing monitoring weight, appetite, natural progression of aging.  I spent 60 minutes providing this consultation,  from 2:45pm to 3:45pm. More than 50% of the time in this consultation was spent coordinating communication.   HISTORY OF PRESENT ILLNESS:  Patricia Bradford is a 86 y.o. year old female with multiple medical problems including CHF, h/o kidney stones.In-person follow-up visit for palliative care for Patricia Bradford and her daughter Patricia Bradford who was present. We talked about purpose of palliative care visit and agreement. We talked about how Patricia Bradford has been doing. Patricia. Bradford does ambulate with a cane with no recent falls. Patricia. Bradford does do her own bath though once a week for daughter Patricia Bradford helps her in the shower. Patricia. Bradford does feed herself and appetite has been good. Patricia. Bradford weight has been relatively stable around 96 lb. Patricia. Bradford had a recent visit at [facility] Provider with labs that were unremarkable per her daughter. No recent inspections, wounds, hospitalizations. We talked about Patricia Bradford daily routine for what she does do a  lot of reading and crossword puzzles. Patricia Bradford does travel with her daughter to [place] once a year for a few months and then return to [place]. We talked about the seventh overall health has been good. No recent episodes of pain, shortness of breath. Medical goals reviewed with wishes for DNR. Patricia Bradford endorses the DNR forms at Patricia. Bradford house and currently she is residing at [facility]. A second DNR form completed, placed in vynca. We talked about for Patricia. Bradford age she is 86 doing remarkably well and stable. We talked about [place] palliative care visit in 2 months if needed or sooner should she declined. Patricia Bradford in agreement, appointment scheduled. Therapeutic listening, emotional support provided. Contact information. Questions answered is satisfaction.  Palliative Care was asked to help to continue to address goals of care.   CODE STATUS: DNR  PPS: 60% HOSPICE ELIGIBILITY/DIAGNOSIS: TBD  PAST MEDICAL HISTORY: No past medical history on file.  SOCIAL HX:  Social History   Tobacco Use   Smoking status: Never Smoker   Smokeless tobacco: Never Used  Substance Use Topics   Alcohol use: Never    ALLERGIES: No Known Allergies   PERTINENT MEDICATIONS:  Outpatient Encounter Medications as of 05/04/2020  Medication Sig   aspirin EC 81 MG tablet Take 81 mg by mouth daily.   carvedilol (COREG) 3.125 MG tablet Take 1 tablet (3.125 mg total) by mouth 2 (two) times daily with a meal.   Cranberry 400 MG CAPS Take 400 mg by mouth daily.   feeding supplement, ENSURE ENLIVE, (ENSURE ENLIVE) LIQD Take 237 mLs by mouth 2 (two) times daily between meals.   furosemide (LASIX) 20  MG tablet Take 1 tablet (20 mg total) by mouth every other day.   losartan (COZAAR) 25 MG tablet Take 1 tablet (25 mg total) by mouth daily.   Multiple Vitamin (MULTI-VITAMIN) tablet Take 1 tablet by mouth daily.   No facility-administered encounter medications on file as of 05/04/2020.    PHYSICAL EXAM:    General: NAD, frail appearing, thin, pleasant elderly female Cardiovascular: regular rate and rhythm Pulmonary: clear ant fields Neurological: walks with cane  Shantika Bermea Prince Rome, NP

## 2020-07-02 ENCOUNTER — Other Ambulatory Visit: Payer: Medicare Other | Admitting: Nurse Practitioner

## 2020-07-02 ENCOUNTER — Other Ambulatory Visit: Payer: Self-pay

## 2020-07-02 ENCOUNTER — Encounter: Payer: Self-pay | Admitting: Nurse Practitioner

## 2020-07-02 DIAGNOSIS — Z515 Encounter for palliative care: Secondary | ICD-10-CM

## 2020-07-02 DIAGNOSIS — I509 Heart failure, unspecified: Secondary | ICD-10-CM

## 2020-07-02 NOTE — Progress Notes (Signed)
Therapist, nutritional Palliative Care Consult Note Telephone: [phone]  Fax: [phone]  PATIENT NAME: Patricia Bradford DOB: [birth year] MRN: [id number]  PRIMARY CARE PROVIDER:   Sherrie Mustache, MD  REFERRING PROVIDER:  Sherrie Mustache, MD [address] [place],  [place] [address]  RESPONSIBLE PARTY:  Daughter, [name], daughter C: [phone] Patient's home # [phone]  RECOMMENDATIONS and PLAN: 1.ACP: DNR/DNI in Vynca  2.Palliative care encounter; Palliative medicine team will continue to support patient, patient's family, and medical team. Visit consisted of counseling and education dealing with the complex and emotionally intense issues of symptom management and palliative care in the setting of serious and potentially life-threatening illness  3. F/u in 3 months or sooner if declines for ongoing monitoring weight, appetite, natural progression of aging.  I spent 60 minutes providing this consultation,  from 2:30pm to 3:30pm. More than 50% of the time in this consultation was spent coordinating communication.   HISTORY OF PRESENT ILLNESS:  Patricia Bradford is a 86 y.o. year old female with multiple medical problems including CHF, h/o kidney stones. In-person visit for palliative care follow up with Ms. Bivins and her daughter Samson Frederic. We talked about purpose of palliative care visit. [name] in agreement. We talked about how she was feeling today. Ms Bivins endorses she just woke up from a nap. We talked about her pattern to sleep. [name] and daughter says she is sleeping at night. We talked about symptoms of pain but she is not exhibiting. We talked about symptoms of shortness of breath, fatigue and weakness which she endorses she is not experiencing. We talked about for appetite which is good. Ms Bivins endorses no noted weight loss. We talked about the family dynamics typically she lives with Darl Pikes with Susan's husband. Currently Darl Pikes  and her husband are in [place] and missed it has decided she did not want to go. Her daughter Samson Frederic brought her back to her own home where she is staying with her until Darl Pikes returns. I will talk to about the possibility of Ms Bivins having mini-strokes. Her speech becomes slurred at different times and it's hard to understand ER more garbled and then it clears. Also Samson Frederic endorses [name] mood swings. Ms. Golda Acre has been verbally challenging with Samson Frederic and her sister Darl Pikes. We talked about personality changes that previously [name] would not have been like but she is irritable at times. We talked about option of trying a prescription antidepressant to see but hello wanted to further discuss with Darl Pikes first. We also discuss think In-person visit for palliative care follow up with Ms. Bivins and her daughter Samson Frederic. We talked about purpose of palliative care visit. [name] in agreement. We talked about how she was feeling today. Ms Bivins endorses she just woke up from a nap. We talked about her pattern to sleep. [name] and daughter says she is sleeping at night. We talked about symptoms of pain but she is not exhibiting. We talked about symptoms of shortness of breath, fatigue and weakness which she endorses she is not experiencing. We talked about for appetite which is good. Ms Bivins endorses no noted weight loss. We talked about the family dynamics typically she lives with Darl Pikes with Susan's husband. Currently Darl Pikes and her husband are in [place] and missed it has decided she did not want to go. Her daughter Samson Frederic brought her back to her own home where she is staying with her until Darl Pikes returns. I will  talk to about the possibility of Ms Bivins having mini-strokes. Her speech becomes slurred at different times and it's hard to understand ER more garbled and then it clears. Also Samson Frederic endorses [name] mood swings. Ms. Golda Acre has been verbally challenging with Samson Frederic and her sister Darl Pikes. We talked about  personality changes that previously [name] would not have been like but she is irritable at times. We talked about option of trying a prescription antidepressant to see but hello wanted to further discuss with Darl Pikes first. We also discuss In-person visit for palliative care follow up with Ms. Bivins and her daughter Samson Frederic. We talked about purpose of palliative care visit. [name] in agreement. We talked about how she was feeling today. Ms Bivins endorses she just woke up from a nap. We talked about her pattern to sleep. [name] and daughter says she is sleeping at night. We talked about symptoms of pain but she is not exhibiting. We talked about symptoms of shortness of breath, fatigue and weakness which she endorses she is not experiencing. We talked about for appetite which is good. Ms Bivins endorses no noted weight loss. We talked about the family dynamics typically she lives with Darl Pikes with Susan's husband. Currently Darl Pikes and her husband are in [place] and missed it has decided she did not want to go. Her daughter Samson Frederic brought her back to her own home where she is staying with her until Darl Pikes returns. I will talk to about the possibility of Ms Bivins having mini-strokes. Her speech becomes slurred at different times and it's hard to understand ER more garbled and then it clears. Also Samson Frederic endorses [name] mood swings. Ms. Golda Acre has been verbally challenging with Samson Frederic and her sister Darl Pikes. We talked about personality changes that previously [name] would not have been like but she is irritable at times. We talked about option of trying a prescription antidepressant to see but hello wanted to further discuss with Darl Pikes first. We also discuss think acoba to see if that hello wanted to further discuss with Darl Pikes first. We talked about gingko biloba over-the-counter to see if that helps with mellowing her mood any. We talked about the losartan that she does take. We talked about chronic disease  progression of congestive heart failure with no recent exacerbations. No noted edema. Ms. Golda Acre does take Lasix one day a week on Friday. We talked about progression with aging in the setting of a late onset stroke. Samson Frederic and I talked about mood and seeing if she was open to taking a medication to help with balancing, easing her irritability. Medical goals of care reviewed. We talked about quality of life. We talked about caregiver fatigue and stress with coping strategies. We talked about the importance of self-care. Discuss that will do a follow-up visit in 2 weeks to see if any changes with the gingko biloba and if not can transition to a SSRI after Samson Frederic further discusses with Darl Pikes. Ella in agreement, appointment scheduled. Contact information provided. Questions answered to satisfaction.  Palliative Care was asked to help to continue to address goals of care.   CODE STATUS: DNR  PPS: 50% HOSPICE ELIGIBILITY/DIAGNOSIS: TBD  PAST MEDICAL HISTORY: History reviewed. No pertinent past medical history.  SOCIAL HX:  Social History   Tobacco Use  . Smoking status: Never Smoker  . Smokeless tobacco: Never Used  Substance Use Topics  . Alcohol use: Never    ALLERGIES: No Known Allergies   PERTINENT MEDICATIONS:  Outpatient  Encounter Medications as of 07/02/2020  Medication Sig  . aspirin EC 81 MG tablet Take 81 mg by mouth daily.  . carvedilol (COREG) 3.125 MG tablet Take 1 tablet (3.125 mg total) by mouth 2 (two) times daily with a meal.  . Cranberry 400 MG CAPS Take 400 mg by mouth daily.  . feeding supplement, ENSURE ENLIVE, (ENSURE ENLIVE) LIQD Take 237 mLs by mouth 2 (two) times daily between meals.  . furosemide (LASIX) 20 MG tablet Take 1 tablet (20 mg total) by mouth every other day.  . losartan (COZAAR) 25 MG tablet Take 1 tablet (25 mg total) by mouth daily.  . Multiple Vitamin (MULTI-VITAMIN) tablet Take 1 tablet by mouth daily.   No facility-administered encounter medications  on file as of 07/02/2020.    PHYSICAL EXAM:   General: frail appearing, thin, elderly female Cardiovascular: regular rate and rhythm Pulmonary: clear ant fields Neurological: walks with cane  Darren Caldron Prince Rome, NP

## 2020-07-20 ENCOUNTER — Other Ambulatory Visit: Payer: Self-pay

## 2020-07-20 ENCOUNTER — Encounter: Payer: Self-pay | Admitting: Nurse Practitioner

## 2020-07-20 ENCOUNTER — Other Ambulatory Visit: Payer: Medicare Other | Admitting: Nurse Practitioner

## 2020-07-20 DIAGNOSIS — Z515 Encounter for palliative care: Secondary | ICD-10-CM

## 2020-07-20 DIAGNOSIS — I509 Heart failure, unspecified: Secondary | ICD-10-CM

## 2020-07-20 NOTE — Progress Notes (Signed)
[facility] Consult Note Telephone: [phone]  Fax: [phone]  PATIENT NAME: TANDREA KOMMER DOB: Jan 30, 1916 MRN: [id number]  PRIMARY CARE PROVIDER:   Casilda Carls, MD  REFERRING PROVIDER:  Casilda Carls, [place],  [place] [address]  RESPONSIBLE PARTY:Daughter, Ms. Deberah Castle, daughter C: [phone] Patient's home # [phone]  RECOMMENDATIONS and PLAN: 1.ACP:DNR/DNI in [place]  2.Palliative care encounter; Palliative medicine team will continue to support patient, patient's family, and medical team. Visit consisted of counseling and education dealing with the complex and emotionally intense issues of symptom management and palliative care in the setting of serious and potentially life-threatening illness  3. F/u in 1 months or sooner if declines for ongoing monitoring weight, appetite, natural progression of aging.  I spent 60 minutes providing this consultation,  from 2:45pm to 3:45pm. More than 50% of the time in this consultation was spent coordinating communication.   HISTORY OF PRESENT ILLNESS:  MARRIANNE SICA is a 86 y.o. year old female with multiple medical problems including CHF, h/o kidney stones. Follow up palliative care visit for Ms Tweed and her daughter [name]. We talked about purpose of palliative care visit. [name] in agreement. We talked about how much did this has been doing since last palliative care visit. [name] endorses she is doing so much better. Ms. Langenfeld started her on a Centrum multivitamin and she has improved considerably. Her strength has improved. Ms. Mayhall mood is 100% better. [name] endorse is Ms Valadez has not tried to argue with her or any type behavioral challenges. We talked about Ms. Dunkleberger appetite which has been very good, no weight change. I met with Ms Lottman and [name] together. We talked about how much Ms. Mee Hives has been feeling. Ms Mette endorses she has  doing much better. Overall Ms. Giacobbe feels much better. Her strength seems to have improved. Ms Blackwell endorses she does continue to walk with her walker. Ms. Jasmer puts the walker by her bed so when she gets ready to get up to go to the bathroom she has it available. We talked about no recent falls, wounds, infections, hospitalizations. We talked about Ms. Mirando appetite which has been good. Ms. Min endorses she does like to eat. They had macaroni and cheese today and law. Ms Chouinard endorses she did cut up the slaw. We talked about tomorrow they are going to have sausage and gravy for lunch. We talked about importance of nutrition, multivitamin and how important for her to take daily. We talked about quality of life as Ms Kleeman continues to read avidly daily. Ms Cohick also completes crossword puzzles and has most of her life. Ms Havel endorses that her vision has been good she does wear glasses but she is able to read newspapers and smaller print. We talked about currently Ms. Kimura is residing in her own home with [name]. Ms. Endicott daughter Manuela Schwartz will be coming back from [place] towards the end of the week and Ms Meadow will be moving back with Manuela Schwartz to her home. We talked about Manuela Schwartz and her husband returning to [place] December 30th. Ms Royle endorses normally Ms. Vohs goes although not sure if she will return to [place] with them this time. Ms Kot endorses will just have to see how she feels. We talked about the travel. We talked about Ms Mckone being able to sit on her porch in her own home but other than that she's been isolated at home. It has  been difficult for [name] to get Ms Yoshino into the car or take her anywhere such as out to eat. We talked about chronic disease, progression of aging. We talked about role of palliative care and plan of care. Discuss will follow up in 4 weeks tired too possible trip to [place] for 4 months and monitoring mood. Praised Ms. Mcchristian for improving her mood  overall and having better interactions with [name]. We talked about scheduled appointment time made with [name]. Ms Quinter in agreement. Therapeutical listening and emotional support provided. Contact information. Questions answered the satisfaction.  Palliative Care was asked to help to continue to address goals of care.   CODE STATUS: DNR  PPS: 50% HOSPICE ELIGIBILITY/DIAGNOSIS: TBD  SOCIAL HX:  Social History   Tobacco Use  . Smoking status: Never Smoker  . Smokeless tobacco: Never Used  Substance Use Topics  . Alcohol use: Never    ALLERGIES: No Known Allergies   PHYSICAL EXAM:   General: NAD, frail appearing, thin, pleasant female Cardiovascular: regular rate and rhythm Pulmonary: clear ant fields Neurological: ambulatory with walker  Oluwatosin Bracy Ihor Gully, NP

## 2020-07-21 ENCOUNTER — Other Ambulatory Visit: Payer: Medicare Other | Admitting: Nurse Practitioner

## 2020-08-04 ENCOUNTER — Telehealth: Payer: Self-pay | Admitting: Nurse Practitioner

## 2020-08-04 NOTE — Telephone Encounter (Signed)
Rec'd email from Triage stating that patient's daughter, Lavone Neri called and wanted to cancel Palliative services at this time.  Will notify Palliative Team.

## 2020-08-04 NOTE — Telephone Encounter (Signed)
Created in error

## 2020-09-01 ENCOUNTER — Other Ambulatory Visit: Payer: Medicare Other | Admitting: Nurse Practitioner

## 2021-06-05 ENCOUNTER — Emergency Department: Payer: Medicare Other

## 2021-06-05 ENCOUNTER — Inpatient Hospital Stay
Admission: EM | Admit: 2021-06-05 | Discharge: 2021-06-08 | DRG: 871 | Disposition: A | Discharge status: Home / Self Care | Payer: Medicare Other | Attending: Internal Medicine | Admitting: Internal Medicine

## 2021-06-05 DIAGNOSIS — E43 Unspecified severe protein-calorie malnutrition: Secondary | ICD-10-CM | POA: Diagnosis present

## 2021-06-05 DIAGNOSIS — J9601 Acute respiratory failure with hypoxia: Secondary | ICD-10-CM | POA: Diagnosis present

## 2021-06-05 DIAGNOSIS — I10 Essential (primary) hypertension: Secondary | ICD-10-CM

## 2021-06-05 DIAGNOSIS — I4891 Unspecified atrial fibrillation: Secondary | ICD-10-CM | POA: Diagnosis present

## 2021-06-05 DIAGNOSIS — I110 Hypertensive heart disease with heart failure: Secondary | ICD-10-CM | POA: Diagnosis present

## 2021-06-05 DIAGNOSIS — Z7982 Long term (current) use of aspirin: Secondary | ICD-10-CM | POA: Diagnosis not present

## 2021-06-05 DIAGNOSIS — Z20822 Contact with and (suspected) exposure to covid-19: Secondary | ICD-10-CM | POA: Diagnosis present

## 2021-06-05 DIAGNOSIS — R778 Other specified abnormalities of plasma proteins: Secondary | ICD-10-CM | POA: Diagnosis present

## 2021-06-05 DIAGNOSIS — L03115 Cellulitis of right lower limb: Secondary | ICD-10-CM | POA: Diagnosis present

## 2021-06-05 DIAGNOSIS — R509 Fever, unspecified: Secondary | ICD-10-CM

## 2021-06-05 DIAGNOSIS — I472 Ventricular tachycardia: Secondary | ICD-10-CM | POA: Diagnosis present

## 2021-06-05 DIAGNOSIS — Z79899 Other long term (current) drug therapy: Secondary | ICD-10-CM | POA: Diagnosis not present

## 2021-06-05 DIAGNOSIS — A419 Sepsis, unspecified organism: Principal | ICD-10-CM | POA: Diagnosis present

## 2021-06-05 DIAGNOSIS — H9190 Unspecified hearing loss, unspecified ear: Secondary | ICD-10-CM | POA: Diagnosis present

## 2021-06-05 DIAGNOSIS — W5501XA Bitten by cat, initial encounter: Secondary | ICD-10-CM

## 2021-06-05 DIAGNOSIS — I428 Other cardiomyopathies: Secondary | ICD-10-CM | POA: Diagnosis present

## 2021-06-05 DIAGNOSIS — Z9181 History of falling: Secondary | ICD-10-CM

## 2021-06-05 DIAGNOSIS — Z66 Do not resuscitate: Secondary | ICD-10-CM | POA: Diagnosis present

## 2021-06-05 DIAGNOSIS — D72829 Elevated white blood cell count, unspecified: Secondary | ICD-10-CM

## 2021-06-05 DIAGNOSIS — I248 Other forms of acute ischemic heart disease: Secondary | ICD-10-CM | POA: Diagnosis present

## 2021-06-05 DIAGNOSIS — Z681 Body mass index (BMI) 19 or less, adult: Secondary | ICD-10-CM

## 2021-06-05 DIAGNOSIS — I5023 Acute on chronic systolic (congestive) heart failure: Secondary | ICD-10-CM | POA: Diagnosis present

## 2021-06-05 DIAGNOSIS — R7989 Other specified abnormal findings of blood chemistry: Secondary | ICD-10-CM | POA: Diagnosis present

## 2021-06-05 DIAGNOSIS — I959 Hypotension, unspecified: Secondary | ICD-10-CM | POA: Diagnosis not present

## 2021-06-05 DIAGNOSIS — R6520 Severe sepsis without septic shock: Secondary | ICD-10-CM | POA: Diagnosis present

## 2021-06-05 DIAGNOSIS — Z87442 Personal history of urinary calculi: Secondary | ICD-10-CM | POA: Diagnosis not present

## 2021-06-05 DIAGNOSIS — R54 Age-related physical debility: Secondary | ICD-10-CM | POA: Diagnosis present

## 2021-06-05 DIAGNOSIS — R000 Tachycardia, unspecified: Secondary | ICD-10-CM | POA: Diagnosis not present

## 2021-06-05 DIAGNOSIS — I5021 Acute systolic (congestive) heart failure: Secondary | ICD-10-CM | POA: Diagnosis not present

## 2021-06-05 HISTORY — DX: Chronic systolic (congestive) heart failure: I50.22

## 2021-06-05 HISTORY — DX: Essential (primary) hypertension: I10

## 2021-06-05 LAB — TROPONIN I (HIGH SENSITIVITY)
Troponin I (High Sensitivity): 38 ng/L — ABNORMAL HIGH (ref <18)
Troponin I (High Sensitivity): 114 ng/L (ref <18)
Troponin I (High Sensitivity): 178 ng/L (ref <18)
Troponin I (High Sensitivity): 163 ng/L (ref <18)

## 2021-06-05 LAB — CBC WITH DIFFERENTIAL/PLATELET
Abs Immature Granulocytes: 0.06 10*3/uL (ref 0.00–0.07)
Basophils Absolute: 0 10*3/uL (ref 0.0–0.1)
Basophils Relative: 0 %
Eosinophils Absolute: 0.1 10*3/uL (ref 0.0–0.5)
Eosinophils Relative: 0 %
HCT: 40.5 % (ref 36.0–46.0)
Hemoglobin: 13.3 g/dL (ref 12.0–15.0)
Immature Granulocytes: 0 %
Lymphocytes Relative: 26 %
Lymphs Abs: 4.9 10*3/uL — ABNORMAL HIGH (ref 0.7–4.0)
MCH: 31.4 pg (ref 26.0–34.0)
MCHC: 32.8 g/dL (ref 30.0–36.0)
MCV: 95.7 fL (ref 80.0–100.0)
Monocytes Absolute: 1.1 10*3/uL — ABNORMAL HIGH (ref 0.1–1.0)
Monocytes Relative: 6 %
Neutro Abs: 12.5 10*3/uL — ABNORMAL HIGH (ref 1.7–7.7)
Neutrophils Relative %: 68 %
Platelets: 141 10*3/uL — ABNORMAL LOW (ref 150–400)
RBC: 4.23 MIL/uL (ref 3.87–5.11)
RDW: 13.5 % (ref 11.5–15.5)
WBC: 18.7 10*3/uL — ABNORMAL HIGH (ref 4.0–10.5)
nRBC: 0 % (ref 0.0–0.2)

## 2021-06-05 LAB — COMPREHENSIVE METABOLIC PANEL
ALT: 13 U/L (ref 0–44)
AST: 24 U/L (ref 15–41)
Albumin: 3.9 g/dL (ref 3.5–5.0)
Alkaline Phosphatase: 59 U/L (ref 38–126)
Anion gap: 10 (ref 5–15)
BUN: 20 mg/dL (ref 8–23)
CO2: 28 mmol/L (ref 22–32)
Calcium: 8.7 mg/dL — ABNORMAL LOW (ref 8.9–10.3)
Chloride: 101 mmol/L (ref 98–111)
Creatinine, Ser: 0.61 mg/dL (ref 0.44–1.00)
GFR, Estimated: >60 mL/min (ref >60)
Glucose, Bld: 168 mg/dL — ABNORMAL HIGH (ref 70–99)
Potassium: 3.9 mmol/L (ref 3.5–5.1)
Sodium: 139 mmol/L (ref 135–145)
Total Bilirubin: 0.8 mg/dL (ref 0.3–1.2)
Total Protein: 6.6 g/dL (ref 6.5–8.1)

## 2021-06-05 LAB — RESP PANEL BY RT-PCR (FLU A&B, COVID) ARPGX2
Influenza A by PCR: NEGATIVE
Influenza B by PCR: NEGATIVE
SARS Coronavirus 2 by RT PCR: NEGATIVE

## 2021-06-05 LAB — URINALYSIS, COMPLETE (UACMP) WITH MICROSCOPIC
Bacteria, UA: NONE SEEN
Bilirubin Urine: NEGATIVE
Glucose, UA: NEGATIVE mg/dL
Ketones, ur: NEGATIVE mg/dL
Leukocytes,Ua: NEGATIVE
Nitrite: NEGATIVE
Protein, ur: NEGATIVE mg/dL
Specific Gravity, Urine: 1.011 (ref 1.005–1.030)
Squamous Epithelial / HPF: NONE SEEN (ref 0–5)
pH: 5 (ref 5.0–8.0)

## 2021-06-05 LAB — BRAIN NATRIURETIC PEPTIDE: B Natriuretic Peptide: 1448.1 pg/mL — ABNORMAL HIGH (ref 0.0–100.0)

## 2021-06-05 MED ORDER — ENSURE ENLIVE PO LIQD
237.0000 mL | Freq: Two times a day (BID) | ORAL | Status: DC
  Administered 2021-06-06 – 2021-06-08 (×5): 237 mL via ORAL

## 2021-06-05 MED ORDER — ENOXAPARIN SODIUM 40 MG/0.4ML IJ SOSY: 40.0000 mg | PREFILLED_SYRINGE | INTRAMUSCULAR | Status: DC

## 2021-06-05 MED ORDER — ACETAMINOPHEN 325 MG PO TABS
650.0000 mg | ORAL_TABLET | Freq: Four times a day (QID) | ORAL | Status: DC | PRN
  Administered 2021-06-05 – 2021-06-06 (×2): 650 mg via ORAL
  Filled 2021-06-05 (×2): qty 2

## 2021-06-05 MED ORDER — DM-GUAIFENESIN ER 30-600 MG PO TB12: 1.0000 | ORAL_TABLET | Freq: Two times a day (BID) | ORAL | Status: DC | PRN

## 2021-06-05 MED ORDER — FUROSEMIDE 10 MG/ML IJ SOLN
20.0000 mg | Freq: Two times a day (BID) | INTRAMUSCULAR | Status: DC
  Administered 2021-06-05 – 2021-06-06 (×3): 20 mg via INTRAVENOUS
  Filled 2021-06-05 (×4): qty 2

## 2021-06-05 MED ORDER — ASPIRIN EC 81 MG PO TBEC
81.0000 mg | DELAYED_RELEASE_TABLET | Freq: Every day | ORAL | Status: DC
  Administered 2021-06-05 – 2021-06-08 (×4): 81 mg via ORAL
  Filled 2021-06-05 (×4): qty 1

## 2021-06-05 MED ORDER — HYDRALAZINE HCL 20 MG/ML IJ SOLN
5.0000 mg | INTRAMUSCULAR | Status: DC | PRN
  Administered 2021-06-06: 5 mg via INTRAVENOUS
  Filled 2021-06-05: qty 1

## 2021-06-05 MED ORDER — FUROSEMIDE 10 MG/ML IJ SOLN
40.0000 mg | Freq: Once | INTRAMUSCULAR | Status: AC
  Administered 2021-06-05: 40 mg via INTRAVENOUS
  Filled 2021-06-05: qty 4

## 2021-06-05 MED ORDER — LOSARTAN POTASSIUM 25 MG PO TABS
25.0000 mg | ORAL_TABLET | Freq: Two times a day (BID) | ORAL | Status: DC
  Administered 2021-06-05 – 2021-06-07 (×5): 25 mg via ORAL
  Filled 2021-06-05 (×5): qty 1

## 2021-06-05 MED ORDER — ADULT MULTIVITAMIN W/MINERALS CH
1.0000 | ORAL_TABLET | Freq: Every day | ORAL | Status: DC
  Administered 2021-06-06 – 2021-06-08 (×3): 1 via ORAL
  Filled 2021-06-05 (×3): qty 1

## 2021-06-05 MED ORDER — ONDANSETRON HCL 4 MG/2ML IJ SOLN
4.0000 mg | Freq: Three times a day (TID) | INTRAMUSCULAR | Status: DC | PRN
  Administered 2021-06-06: 4 mg via INTRAVENOUS
  Filled 2021-06-05: qty 2

## 2021-06-05 MED ORDER — CRANBERRY 400 MG PO CAPS: 400.0000 mg | ORAL_CAPSULE | Freq: Every day | ORAL | Status: DC

## 2021-06-05 MED ORDER — ENOXAPARIN SODIUM 30 MG/0.3ML IJ SOSY
30.0000 mg | PREFILLED_SYRINGE | INTRAMUSCULAR | Status: DC
  Administered 2021-06-05 – 2021-06-07 (×3): 30 mg via SUBCUTANEOUS
  Filled 2021-06-05 (×3): qty 0.3

## 2021-06-05 MED ORDER — ALBUTEROL SULFATE (2.5 MG/3ML) 0.083% IN NEBU: 2.5000 mg | INHALATION_SOLUTION | RESPIRATORY_TRACT | Status: DC | PRN

## 2021-06-05 MED ORDER — ALBUTEROL SULFATE HFA 108 (90 BASE) MCG/ACT IN AERS
2.0000 | INHALATION_SPRAY | RESPIRATORY_TRACT | Status: DC | PRN
Start: 2021-06-05 — End: 2021-06-05

## 2021-06-05 NOTE — Progress Notes (Signed)
PHARMACIST - PHYSICIAN COMMUNICATION  CONCERNING:  Enoxaparin (Lovenox) for DVT Prophylaxis    RECOMMENDATION: Patient was prescribed enoxaprin 40mg  q24 hours for VTE prophylaxis.   Filed Weights   06/05/21 1002  Weight: 45 kg (99 lb 3.3 oz)    Body mass index is 16.01 kg/m.  Estimated Creatinine Clearance: 24.6 mL/min (by C-G formula based on SCr of 0.61 mg/dL).  Patient is candidate for enoxaparin 30mg  every 24 hours based on CrCl <41ml/min or Weight <45kg  DESCRIPTION: Pharmacy has adjusted enoxaparin dose per [facility] policy.  Patient is now receiving enoxaparin 30 mg every 24 hours    31m, PharmD Clinical Pharmacist  06/05/2021 7:53 PM

## 2021-06-05 NOTE — ED Triage Notes (Signed)
20 female sob started today

## 2021-06-05 NOTE — ED Provider Notes (Addendum)
[facility]  ____________________________________________   Event Date/Time   First MD Initiated Contact with Patient 06/05/21 1009     (approximate)  I have reviewed the triage vital signs and the nursing notes.   HISTORY  Chief Complaint Shortness of Breath (73 female, sob )    HPI Patricia Bradford is a 87 y.o. female advanced heart failure (EF 20%), presumed nonischemic cardiomyopathy, no ICD,DNR, hypertension, underweight, HFrEF EF 20-25% who presents with respiratory distress.  Per EMS patient woke today around 8 AM and had shortness of breath.  Patient endorses dyspnea similar to prior times and she has had to come to the hospital.  Patient is hard of hearing and it is difficult to obtain history while she is on BiPAP.  She is able to tell me that she has diffuse body pain.  There is not 1 area that is worse than others.  Denies cough fevers or chills.         History reviewed. No pertinent past medical history.  Patient Active Problem List   Diagnosis Date Noted   CHF (congestive heart failure) (HCC) 06/27/2019    History reviewed. No pertinent surgical history.  Prior to Admission medications   Medication Sig Start Date End Date Taking? Authorizing Provider  aspirin EC 81 MG tablet Take 81 mg by mouth daily.    [provider]  carvedilol (COREG) 3.125 MG tablet Take 1 tablet (3.125 mg total) by mouth 2 (two) times daily with a meal. 06/30/19   Enedina Finner, MD  Cranberry 400 MG CAPS Take 400 mg by mouth daily.    [provider]  feeding supplement, ENSURE ENLIVE, (ENSURE ENLIVE) LIQD Take 237 mLs by mouth 2 (two) times daily between meals. 06/30/19   Enedina Finner, MD  furosemide (LASIX) 20 MG tablet Take 1 tablet (20 mg total) by mouth every other day. 06/30/19   Enedina Finner, MD  losartan (COZAAR) 25 MG tablet Take 1 tablet (25 mg total) by mouth daily. 06/30/19   Enedina Finner, MD  Multiple Vitamin (MULTI-VITAMIN) tablet Take  1 tablet by mouth daily.    [provider]    Allergies Patient has no known allergies.  No family history on file.  Social History Social History   Tobacco Use   Smoking status: Never   Smokeless tobacco: Never  Substance Use Topics   Alcohol use: Never   Drug use: Never    Review of Systems   Review of Systems  Unable to perform ROS: Acuity of condition   Physical Exam Updated Vital Signs BP (!) 165/86 (BP Location: Right Arm)   Pulse (!) 110   Temp 97.9 F (36.6 C) (Oral)   Resp (!) 32   Ht 5\' 6"  (1.676 m)   Wt 45 kg   SpO2 100%   BMI 16.01 kg/m   Physical Exam Vitals and nursing note reviewed.  Constitutional:      General: She is in acute distress.     Appearance: Normal appearance. She is not toxic-appearing.     Comments: Cachectic, chronically ill-appearing  HENT:     Head: Normocephalic and atraumatic.     Nose: Nose normal. No congestion.     Mouth/Throat:     Mouth: Mucous membranes are moist.  Eyes:     General: No scleral icterus.    Conjunctiva/sclera: Conjunctivae normal.  Cardiovascular:     Rate and Rhythm: Normal rate and regular rhythm.  Pulmonary:     Effort:  Respiratory distress present.     Breath sounds: No wheezing.     Comments: Patient is tachypneic but able to speak in short sentences, she has decreased breath sounds throughout with some wheezing, supraclavicular retractions Abdominal:     Palpations: Abdomen is soft.     Tenderness: There is no abdominal tenderness. There is no guarding.  Musculoskeletal:        General: No swelling, tenderness, deformity or signs of injury. Normal range of motion.     Cervical back: Neck supple. No rigidity.     Right lower leg: Edema present.     Left lower leg: Edema present.     Comments: 1+ pitting edema of the lower extremities  Skin:    General: Skin is warm and dry.     Coloration: Skin is not jaundiced.  Neurological:     General: No focal deficit present.     Mental  Status: She is alert and oriented to person, place, and time.  Psychiatric:        Mood and Affect: Mood normal.        Behavior: Behavior normal.     LABS (all labs ordered are listed, but only abnormal results are displayed)  Labs Reviewed  COMPREHENSIVE METABOLIC PANEL - Abnormal; Notable for the following components:      Result Value   Glucose, Bld 168 (*)    Calcium 8.7 (*)    All other components within normal limits  BRAIN NATRIURETIC PEPTIDE - Abnormal; Notable for the following components:   B Natriuretic Peptide 1,448.1 (*)    All other components within normal limits  CBC WITH DIFFERENTIAL/PLATELET - Abnormal; Notable for the following components:   WBC 18.7 (*)    Platelets 141 (*)    Neutro Abs 12.5 (*)    Lymphs Abs 4.9 (*)    Monocytes Absolute 1.1 (*)    All other components within normal limits  TROPONIN I (HIGH SENSITIVITY) - Abnormal; Notable for the following components:   Troponin I (High Sensitivity) 38 (*)    All other components within normal limits  RESP PANEL BY RT-PCR (FLU A&B, COVID) ARPGX2  URINALYSIS, COMPLETE (UACMP) WITH MICROSCOPIC   ____________________________________________  EKG  Left axis deviation, nonspecific interventricular conduction delay, no acute ischemic changes ____________________________________________  RADIOLOGY Ky Barban, personally viewed and evaluated these images (plain radiographs) as part of my medical decision making, as well as reviewing the written report by the radiologist.  ED MD interpretation: Reviewed the chest x-ray which shows some questionable intra central infiltrates    ____________________________________________   PROCEDURES  Procedure(s) performed (including Critical Care):  .Critical Care Performed by: Georga Hacking, MD Authorized by: Georga Hacking, MD   Critical care provider statement:    Critical care time (minutes):  45   Critical care was necessary to treat  or prevent imminent or life-threatening deterioration of the following conditions:  Respiratory failure   Critical care was time spent personally by me on the following activities:  Discussions with consultants, evaluation of patient's response to treatment, examination of patient, ordering and performing treatments and interventions, ordering and review of laboratory studies, ordering and review of radiographic studies, pulse oximetry, re-evaluation of patient's condition, obtaining history from patient or surrogate and review of old charts   ____________________________________________   INITIAL IMPRESSION / ASSESSMENT AND PLAN / ED COURSE     87 year old female with history of systolic heart failure who presents with respiratory distress.  She was  placed on BiPAP by EMS due to work of breathing.  On arrival patient looks fairly comfortable on the BiPAP, does become somewhat distressed when taken off.  She is satting normally on the BiPAP, vital signs notable for tachycardia tachypnea and hypertension.  Patient has a history of CHF, no history of COPD so I suspect that that is going on.  Bedside ultrasound shows diffuse B-lines and a poor EF consistent with a CHF exacerbation.  We will continue with the BiPAP, likely diuresis.  Will require admission.    Patient's labs are notable for elevated BNP and troponin.  Will renal function.  She also has a leukocytosis to almost 19, no other focal infectious symptoms.  We will send a urine as well.  Patient was able to come off BiPAP is satting well on 2 L nasal cannula.  Appears comfortable.  She was given a dose of IV Lasix.  Given how she presented will admit to the hospital for ongoing diuresis and monitoring. Clinical Course as of 06/05/21 1131  Sat Jun 05, 2021  1107 B Natriuretic Peptide(!): 1,448.1 [KM]    Clinical Course User Index [KM] Georga Hacking, MD     ____________________________________________   FINAL CLINICAL  IMPRESSION(S) / ED DIAGNOSES  Final diagnoses:  Acute on chronic systolic congestive heart failure [facility])     ED Discharge Orders     None        Note:  This document was prepared using Dragon voice recognition software and may include unintentional dictation errors.    Georga Hacking, MD 06/05/21 1131    Georga Hacking, MD 06/05/21 1131

## 2021-06-05 NOTE — H&P (Signed)
History and Physical    HEAVENLY CHRISTINE CZY:[id number] DOB: Dec 21, 1915 DOA: 06/05/2021  Referring MD/NP/PA:   PCP: Sherrie Mustache, MD   Patient coming from:  The patient is coming from home.  At baseline, pt is independent for most of ADL.        Chief Complaint: SOB  HPI: AFSANA LIERA is a 87 y.o. female with medical history significant of hypertension, sCHF with EF of 20-25%, hard of hearing, who presents with shortness of breath.  Per her daughter, patient started having shortness of breath at about 8 AM, which has been progressively worsening.  Patient does not have chest pain, fever or chills.  Patient has mild cough.  No nausea, vomiting, diarrhea or abdominal pain.  No symptoms of UTI.  Patient was found to have severe respiratory distress, BiPAP was started initially in ED, then weaned off after improvement.  Patient is currently on 2 L oxygen with saturation 98%.  ED Course: pt was found to have BNP 14.8, troponin level 38, negative COVID PCR, electrolytes renal function okay, temperature normal, blood pressure 160/74, heart rate 100, 82, RR 33, 18.  Checks x-ray negative.  Patient is admitted to progressive bed as inpatient  Review of Systems:   General: no fevers, chills, no body weight gain, has fatigue HEENT: no blurry vision, hearing changes or sore throat Respiratory: Has dyspnea, coughing, no wheezing CV: no chest pain, no palpitations GI: no nausea, vomiting, abdominal pain, diarrhea, constipation GU: no dysuria, burning on urination, increased urinary frequency, hematuria  Ext: Has trace leg edema Neuro: no unilateral weakness, numbness, or tingling, no vision change or hearing loss Skin: no rash, no skin tear. MSK: No muscle spasm, no deformity, no limitation of range of movement in spin Heme: No easy bruising.  Travel history: No recent long distant travel.  Allergy: No Known Allergies  Past Medical History:  Diagnosis Date   Chronic systolic congestive heart  failure (HCC)    HTN (hypertension)     Past Surgical History:  Procedure Laterality Date   kidney stone N/A     Social History:  reports that she has never smoked. She has never used smokeless tobacco. She reports that she does not drink alcohol and does not use drugs.  Family History:  Family History  Problem Relation Age of Onset   Colon cancer Sister      Prior to Admission medications   Medication Sig Start Date End Date Taking? Authorizing Provider  aspirin EC 81 MG tablet Take 81 mg by mouth daily.    [provider]  carvedilol (COREG) 3.125 MG tablet Take 1 tablet (3.125 mg total) by mouth 2 (two) times daily with a meal. 06/30/19   Enedina Finner, MD  Cranberry 400 MG CAPS Take 400 mg by mouth daily.    [provider]  feeding supplement, ENSURE ENLIVE, (ENSURE ENLIVE) LIQD Take 237 mLs by mouth 2 (two) times daily between meals. 06/30/19   Enedina Finner, MD  furosemide (LASIX) 20 MG tablet Take 1 tablet (20 mg total) by mouth every other day. 06/30/19   Enedina Finner, MD  losartan (COZAAR) 25 MG tablet Take 1 tablet (25 mg total) by mouth daily. 06/30/19   Enedina Finner, MD  Multiple Vitamin (MULTI-VITAMIN) tablet Take 1 tablet by mouth daily.    [provider]    Physical Exam: Vitals:   06/05/21 1001 06/05/21 1002 06/05/21 1004 06/05/21 1134  BP: (!) 165/86   (!) 160/74  Pulse: Marland Kitchen)  110   89  Resp: (!) 32   18  Temp:   97.9 F (36.6 C) 98.2 F (36.8 C)  TempSrc:   Oral Oral  SpO2: 100%   100%  Weight:  45 kg    Height:  5\' 6"  (1.676 m)     General: Not in acute distress HEENT:       Eyes: PERRL, EOMI, no scleral icterus.       ENT: No discharge from the ears and nose, no pharynx injection, no tonsillar enlargement.        Neck: Positive JVD, no bruit, no mass felt. Heme: No neck lymph node enlargement. Cardiac: S1/S2, RRR, No murmurs, No gallops or rubs. Respiratory: Has fine crackles bilaterally GI: Soft, nondistended, nontender, no  rebound pain, no organomegaly, BS present. GU: No hematuria Ext: Has trace leg edema bilaterally. 1+DP/PT pulse bilaterally. Musculoskeletal: No joint deformities, No joint redness or warmth, no limitation of ROM in spin. Skin: No rashes.  Neuro: Alert, cranial nerves II-XII grossly intact, moves all extremities normally. Psych: Patient is not psychotic, no suicidal or hemocidal ideation.  Labs on Admission: I have personally reviewed following labs and imaging studies  CBC: Recent Labs  Lab 06/05/21 1010  WBC 18.7*  NEUTROABS 12.5*  HGB 13.3  HCT 40.5  MCV 95.7  PLT 141*   Basic Metabolic Panel: Recent Labs  Lab 06/05/21 1010  NA 139  K 3.9  CL 101  CO2 28  GLUCOSE 168*  BUN 20  CREATININE 0.61  CALCIUM 8.7*   GFR: Estimated Creatinine Clearance: 24.6 mL/min (by C-G formula based on SCr of 0.61 mg/dL). Liver Function Tests: Recent Labs  Lab 06/05/21 1010  AST 24  ALT 13  ALKPHOS 59  BILITOT 0.8  PROT 6.6  ALBUMIN 3.9   No results for input(s): LIPASE, AMYLASE in the last 168 hours. No results for input(s): AMMONIA in the last 168 hours. Coagulation Profile: No results for input(s): INR, PROTIME in the last 168 hours. Cardiac Enzymes: No results for input(s): CKTOTAL, CKMB, CKMBINDEX, TROPONINI in the last 168 hours. BNP (last 3 results) No results for input(s): PROBNP in the last 8760 hours. HbA1C: No results for input(s): HGBA1C in the last 72 hours. CBG: No results for input(s): GLUCAP in the last 168 hours. Lipid Profile: No results for input(s): CHOL, HDL, LDLCALC, TRIG, CHOLHDL, LDLDIRECT in the last 72 hours. Thyroid Function Tests: No results for input(s): TSH, T4TOTAL, FREET4, T3FREE, THYROIDAB in the last 72 hours. Anemia Panel: No results for input(s): VITAMINB12, FOLATE, FERRITIN, TIBC, IRON, RETICCTPCT in the last 72 hours. Urine analysis: No results found for: COLORURINE, APPEARANCEUR, LABSPEC, PHURINE, GLUCOSEU, HGBUR, BILIRUBINUR,  KETONESUR, PROTEINUR, UROBILINOGEN, NITRITE, LEUKOCYTESUR Sepsis Labs: @LABRCNTIP (procalcitonin:4,lacticidven:4) ) Recent Results (from the past 240 hour(s))  Resp Panel by RT-PCR (Flu A&B, Covid) Nasopharyngeal Swab     Status: None   Collection Time: 06/05/21 10:11 AM   Specimen: Nasopharyngeal Swab; Nasopharyngeal(NP) swabs in vial transport medium  Result Value Ref Range Status   SARS Coronavirus 2 by RT PCR NEGATIVE NEGATIVE Final    Comment: (NOTE) SARS-CoV-2 target nucleic acids are NOT DETECTED.  The SARS-CoV-2 RNA is generally detectable in upper respiratory specimens during the acute phase of infection. The lowest concentration of SARS-CoV-2 viral copies this assay can detect is 138 copies/mL. A negative result does not preclude SARS-Cov-2 infection and should not be used as the sole basis for treatment or other patient management decisions. A negative result may occur with  improper specimen collection/handling, submission of specimen other than nasopharyngeal swab, presence of viral mutation(s) within the areas targeted by this assay, and inadequate number of viral copies(<138 copies/mL). A negative result must be combined with clinical observations, patient history, and epidemiological information. The expected result is Negative.  Fact Sheet for Patients:  BloggerCourse.com  Fact Sheet for Healthcare Providers:  SeriousBroker.it  This test is no t yet approved or cleared by the [place] FDA and  has been authorized for detection and/or diagnosis of SARS-CoV-2 by FDA under an Emergency Use Authorization (EUA). This EUA will remain  in effect (meaning this test can be used) for the duration of the COVID-19 declaration under Section 564(b)(1) of the Act, 21 U.S.C.section 360bbb-3(b)(1), unless the authorization is terminated  or revoked sooner.       Influenza A by PCR NEGATIVE NEGATIVE Final   Influenza B by  PCR NEGATIVE NEGATIVE Final    Comment: (NOTE) The Xpert Xpress SARS-CoV-2/FLU/RSV plus assay is intended as an aid in the diagnosis of influenza from Nasopharyngeal swab specimens and should not be used as a sole basis for treatment. Nasal washings and aspirates are unacceptable for Xpert Xpress SARS-CoV-2/FLU/RSV testing.  Fact Sheet for Patients: BloggerCourse.com  Fact Sheet for Healthcare Providers: SeriousBroker.it  This test is not yet approved or cleared by the [place] FDA and has been authorized for detection and/or diagnosis of SARS-CoV-2 by FDA under an Emergency Use Authorization (EUA). This EUA will remain in effect (meaning this test can be used) for the duration of the COVID-19 declaration under Section 564(b)(1) of the Act, 21 U.S.C. section 360bbb-3(b)(1), unless the authorization is terminated or revoked.  Performed at [facility], [address], [place], [place] [address]      Radiological Exams on Admission: DG Chest Portable 1 View  Result Date: 06/05/2021 CLINICAL DATA:  Difficulty breathing starting today. Pt on respirator and unable to answer question, could not obtain history. EXAM: PORTABLE CHEST 1 VIEW COMPARISON:  06/27/2019 FINDINGS: Cardiac silhouette is normal in size. No mediastinal or hilar masses. Lungs are hyperexpanded. There is chronic interstitial thickening, most evident at the bases, stable. No evidence of pneumonia or pulmonary edema. No convincing pleural effusion and no pneumothorax. Skeletal structures are grossly intact. IMPRESSION: 1. No acute cardiopulmonary disease. Electronically Signed   By: Amie Portland M.D.   On: 06/05/2021 10:28     EKG: I have personally reviewed.  Sinus rhythm, QTC 466, LAD, poor R wave progression, poor EKG strip quality  Assessment/Plan Principal Problem:   Acute on chronic systolic CHF (congestive heart failure) (HCC) Active Problems:    HTN (hypertension)   Leukocytosis   Elevated troponin   Protein-calorie malnutrition, severe (HCC)   Acute on chronic systolic CHF (congestive heart failure) (HCC): 2D echo on 06/28/2019 showed EF of 20-25%.  Patient has leg edema, elevated BNP 1448, positive JVD, fine crackles on auscultation, clinically consistent with CHF exacerbation.  -Will admit to progressive unit as inpatient -Lasix 20 mg bid by IV (patient received 40 mg of Lasix in ED) -2d echo -Daily weights -strict I/O's -Low salt diet -Fluid restriction -Obtain REDs Vest reading  Elevated troponin: trop 38.  No chest pain, likely demand ischemia. -Trend troponin -Follow-up 2D echo -Aspirin -Check A1c, FLP  HTN (hypertension) -IV hydralazine as needed -Continue home Cozaar  Leukocytosis: WBC 18.7, no source of infection identified.  Chest x-ray negative, possibly reactive -Follow-up urinalysis  Protein-calorie malnutrition, severe (HCC): BMI 16.01 -Nutrition supplement  DVT ppx: sQ Lovenox Code Status: DNR per her daughter Family Communication:  Yes, patient's  daughter  at bed side Disposition Plan:  Anticipate discharge back to previous environment Consults called:  none Admission status and Level of care: Progressive Cardiac:    as inpt      Status is: Inpatient  Remains inpatient appropriate because:Inpatient level of care appropriate due to severity of illness  Dispo: The patient is from: Home              Anticipated d/c is to: Home              Patient currently is not medically stable to d/c.   Difficult to place patient No           Date of Service 06/05/2021    Lorretta Harp [facility]   If 7PM-7AM, please contact night-coverage [email or web address] 06/05/2021, 12:18 PM

## 2021-06-06 ENCOUNTER — Other Ambulatory Visit: Payer: Self-pay

## 2021-06-06 DIAGNOSIS — I5023 Acute on chronic systolic (congestive) heart failure: Secondary | ICD-10-CM | POA: Diagnosis not present

## 2021-06-06 LAB — LIPID PANEL
Cholesterol: 153 mg/dL (ref 0–200)
HDL: 70 mg/dL (ref >40)
LDL Cholesterol: 76 mg/dL (ref 0–99)
Total CHOL/HDL Ratio: 2.2 RATIO
Triglycerides: 33 mg/dL (ref <150)
VLDL: 7 mg/dL (ref 0–40)

## 2021-06-06 LAB — MAGNESIUM: Magnesium: 1.8 mg/dL (ref 1.7–2.4)

## 2021-06-06 LAB — CBC
HCT: 34.6 % — ABNORMAL LOW (ref 36.0–46.0)
Hemoglobin: 11.6 g/dL — ABNORMAL LOW (ref 12.0–15.0)
MCH: 31.4 pg (ref 26.0–34.0)
MCHC: 33.5 g/dL (ref 30.0–36.0)
MCV: 93.8 fL (ref 80.0–100.0)
Platelets: 139 10*3/uL — ABNORMAL LOW (ref 150–400)
RBC: 3.69 MIL/uL — ABNORMAL LOW (ref 3.87–5.11)
RDW: 13.8 % (ref 11.5–15.5)
WBC: 14.6 10*3/uL — ABNORMAL HIGH (ref 4.0–10.5)
nRBC: 0 % (ref 0.0–0.2)

## 2021-06-06 LAB — BASIC METABOLIC PANEL
Anion gap: 8 (ref 5–15)
BUN: 22 mg/dL (ref 8–23)
CO2: 28 mmol/L (ref 22–32)
Calcium: 8.5 mg/dL — ABNORMAL LOW (ref 8.9–10.3)
Chloride: 101 mmol/L (ref 98–111)
Creatinine, Ser: 0.77 mg/dL (ref 0.44–1.00)
GFR, Estimated: >60 mL/min (ref >60)
Glucose, Bld: 111 mg/dL — ABNORMAL HIGH (ref 70–99)
Potassium: 4.2 mmol/L (ref 3.5–5.1)
Sodium: 137 mmol/L (ref 135–145)

## 2021-06-06 LAB — TROPONIN I (HIGH SENSITIVITY): Troponin I (High Sensitivity): 97 ng/L — ABNORMAL HIGH (ref <18)

## 2021-06-06 MED ORDER — IBUPROFEN 400 MG PO TABS: 400.0000 mg | ORAL_TABLET | Freq: Four times a day (QID) | ORAL | Status: DC | PRN

## 2021-06-06 MED ORDER — MORPHINE SULFATE (PF) 2 MG/ML IV SOLN
INTRAVENOUS | Status: AC
  Filled 2021-06-06: qty 1

## 2021-06-06 MED ORDER — MAGNESIUM SULFATE 2 GM/50ML IV SOLN
2.0000 g | Freq: Once | INTRAVENOUS | Status: AC
  Administered 2021-06-06: 2 g via INTRAVENOUS
  Filled 2021-06-06: qty 50

## 2021-06-06 MED ORDER — NITROGLYCERIN 0.4 MG SL SUBL
0.4000 mg | SUBLINGUAL_TABLET | SUBLINGUAL | Status: DC | PRN
  Administered 2021-06-06: 0.4 mg via SUBLINGUAL
  Filled 2021-06-06 (×2): qty 1

## 2021-06-06 MED ORDER — DILTIAZEM HCL 25 MG/5ML IV SOLN
5.0000 mg | Freq: Once | INTRAVENOUS | Status: AC
  Administered 2021-06-06: 5 mg via INTRAVENOUS
  Filled 2021-06-06: qty 5

## 2021-06-06 MED ORDER — MORPHINE SULFATE (PF) 2 MG/ML IV SOLN
2.0000 mg | INTRAVENOUS | Status: DC | PRN
  Administered 2021-06-06: 2 mg via INTRAVENOUS

## 2021-06-06 MED ORDER — CEPHALEXIN 500 MG PO CAPS
500.0000 mg | ORAL_CAPSULE | Freq: Three times a day (TID) | ORAL | Status: DC
  Administered 2021-06-06 – 2021-06-07 (×3): 500 mg via ORAL
  Filled 2021-06-06 (×3): qty 1

## 2021-06-06 MED ORDER — LABETALOL HCL 5 MG/ML IV SOLN: 20.0000 mg | INTRAVENOUS | Status: DC | PRN

## 2021-06-06 MED ORDER — DIGOXIN 0.25 MG/ML IJ SOLN
0.2500 mg | Freq: Once | INTRAMUSCULAR | Status: AC
  Administered 2021-06-06: 0.25 mg via INTRAVENOUS
  Filled 2021-06-06: qty 2

## 2021-06-06 NOTE — Plan of Care (Signed)

## 2021-06-06 NOTE — Progress Notes (Signed)
PROGRESS NOTE    Patricia Bradford   ZOX:[id number]  DOB: 1916/08/09  PCP: Sherrie Mustache, MD    DOA: 06/05/2021 LOS: 1    Brief Narrative / Hospital Course to Date:   87 year old female with past medical history of hypertension, systolic CHF with EF of 20 to 81%.  Hard of hearing who presented to the ED on 06/05/2021 with complaints of shortness of breath progressively worsening.  On arrival to the ED patient was in respiratory distress and required BiPAP initially.  She was later weaned off BiPAP and maintaining sats on 2 L/min nasal cannula O2.  Found to have acutely decompensated CHF and admitted to [facility] service undergoing IV diuresis  Assessment & Plan   Principal Problem:   Acute on chronic systolic CHF (congestive heart failure) (HCC) Active Problems:   HTN (hypertension)   Leukocytosis   Elevated troponin   Protein-calorie malnutrition, severe (HCC)  Root acute respiratory failure with hypoxia due to Acute on chronic systolic CHF -prior echo with EF of 20-25%. Responding well to IV Lasix. -- Continue IV Lasix --Supplement oxygen to maintain sats above 90% -- Monitor renal function and electrolytes --Monitor volume status: Daily weights and strict I/O's --Follow-up pending echo --Low-sodium diet with fluid restriction  Elevated troponin -mild, most likely demand ischemia.  Patient is not having any chest pain and no acute ischemic changes on EKG.  Continue on aspirin.  Hypertension -continue on home losartan.  IV hydralazine as needed  Leukocytosis -present with WBC 18 point 7K but without any obvious source of infection.  Chest x-ray was negative. 9/25: Patient's right lower extremity appears to have early signs of cellulitis surrounding an area where she was bitten by a cat about 2 days ago.  Right lower extremity cellulitis -present on admission.   Start on Keflex. No proximal tracking of erythema seen.  Severe protein calorie malnutrition -  Patient BMI: Body  mass index is 15.69 kg/m.  Nutritional supplement drinks ordered.  DVT prophylaxis: enoxaparin (LOVENOX) injection 30 mg Start: 06/05/21 2200   Diet:  Diet Orders (From admission, onward)     Start     Ordered   06/05/21 1950  Diet 2 gram sodium Room service appropriate? Yes; Fluid consistency: Thin  Diet effective now       Question Answer Comment  Room service appropriate? Yes   Fluid consistency: Thin      06/05/21 1949              Code Status: DNR   Subjective 06/06/21    Patient awake sitting up in bed when seen on rounds this morning.  Daughter was at bedside.  They are hoping for patient to be discharged today.  Regarding her right leg pain and erythema, patient was apparently bitten by daughter's cat about 2 days ago.  She is not having any fevers or chills.  Denies chest pain or shortness of breath.   Disposition Plan & Communication   Status is: Inpatient  Remains inpatient appropriate because: Remains on IV diuresis and echo is pending  Dispo: The patient is from: Home              Anticipated d/c is to: Home              Patient currently is not medically stable to d/c.   Difficult to place patient No   Family Communication: Daughter at bedside on rounds today 9/25   Consults, Procedures, Significant Events   Consultants:  None  Procedures:  None  Antimicrobials:  Anti-infectives (From admission, onward)    Start     Dose/Rate Route Frequency Ordered Stop   06/06/21 1400  cephALEXin (KEFLEX) capsule 500 mg        500 mg Oral Every 8 hours 06/06/21 0947 06/11/21 1359         Micro    Objective   Vitals:   06/05/21 2212 06/06/21 0605 06/06/21 0911 06/06/21 1135  BP: 131/63  (!) 117/55 (!) 106/58  Pulse: 88  81 78  Resp: 17     Temp: 98.3 F (36.8 C)  97.8 F (36.6 C) 98.8 F (37.1 C)  TempSrc:   Oral Oral  SpO2: 96%  98% 96%  Weight:  44.1 kg    Height:        Intake/Output Summary (Last 24 hours) at 06/06/2021 1524 Last  data filed at 06/06/2021 1340 Gross per 24 hour  Intake 480 ml  Output 550 ml  Net -70 ml   Filed Weights   06/05/21 1002 06/06/21 0605  Weight: 45 kg 44.1 kg    Physical Exam:  General exam: awake, alert, no acute distress HEENT: moist mucus membranes, hard of hearing  Respiratory system: CTAB, no wheezes, rales or rhonchi, normal respiratory effort. Cardiovascular system: normal S1/S2, RRR, no JVD, murmurs, rubs, gallops, no pedal edema.   Gastrointestinal system: soft, NT, ND, no HSM felt, +bowel sounds. Central nervous system: no gross focal neurologic deficits, normal speech Extremities: Right medial calf with small skin tear/wound with nonpurulent drainage and surrounding erythema with differential warmth but no tracking of erythema proximally Psychiatry: normal mood, congruent affect  Labs   Data Reviewed: I have personally reviewed following labs and imaging studies  CBC: Recent Labs  Lab 06/05/21 1010 06/06/21 0614  WBC 18.7* 14.6*  NEUTROABS 12.5*  --   HGB 13.3 11.6*  HCT 40.5 34.6*  MCV 95.7 93.8  PLT 141* 139*   Basic Metabolic Panel: Recent Labs  Lab 06/05/21 1010 06/06/21 0614  NA 139 137  K 3.9 4.2  CL 101 101  CO2 28 28  GLUCOSE 168* 111*  BUN 20 22  CREATININE 0.61 0.77  CALCIUM 8.7* 8.5*  MG  --  1.8   GFR: Estimated Creatinine Clearance: 24.1 mL/min (by C-G formula based on SCr of 0.77 mg/dL). Liver Function Tests: Recent Labs  Lab 06/05/21 1010  AST 24  ALT 13  ALKPHOS 59  BILITOT 0.8  PROT 6.6  ALBUMIN 3.9   No results for input(s): LIPASE, AMYLASE in the last 168 hours. No results for input(s): AMMONIA in the last 168 hours. Coagulation Profile: No results for input(s): INR, PROTIME in the last 168 hours. Cardiac Enzymes: No results for input(s): CKTOTAL, CKMB, CKMBINDEX, TROPONINI in the last 168 hours. BNP (last 3 results) No results for input(s): PROBNP in the last 8760 hours. HbA1C: No results for input(s): HGBA1C in  the last 72 hours. CBG: No results for input(s): GLUCAP in the last 168 hours. Lipid Profile: Recent Labs    06/06/21 0614  CHOL 153  HDL 70  LDLCALC 76  TRIG 33  CHOLHDL 2.2   Thyroid Function Tests: No results for input(s): TSH, T4TOTAL, FREET4, T3FREE, THYROIDAB in the last 72 hours. Anemia Panel: No results for input(s): VITAMINB12, FOLATE, FERRITIN, TIBC, IRON, RETICCTPCT in the last 72 hours. Sepsis Labs: No results for input(s): PROCALCITON, LATICACIDVEN in the last 168 hours.  Recent Results (from the past 240 hour(s))  Resp Panel by  RT-PCR (Flu A&B, Covid) Nasopharyngeal Swab     Status: None   Collection Time: 06/05/21 10:11 AM   Specimen: Nasopharyngeal Swab; Nasopharyngeal(NP) swabs in vial transport medium  Result Value Ref Range Status   SARS Coronavirus 2 by RT PCR NEGATIVE NEGATIVE Final    Comment: (NOTE) SARS-CoV-2 target nucleic acids are NOT DETECTED.  The SARS-CoV-2 RNA is generally detectable in upper respiratory specimens during the acute phase of infection. The lowest concentration of SARS-CoV-2 viral copies this assay can detect is 138 copies/mL. A negative result does not preclude SARS-Cov-2 infection and should not be used as the sole basis for treatment or other patient management decisions. A negative result may occur with  improper specimen collection/handling, submission of specimen other than nasopharyngeal swab, presence of viral mutation(s) within the areas targeted by this assay, and inadequate number of viral copies(<138 copies/mL). A negative result must be combined with clinical observations, patient history, and epidemiological information. The expected result is Negative.  Fact Sheet for Patients:  BloggerCourse.com  Fact Sheet for Healthcare Providers:  SeriousBroker.it  This test is no t yet approved or cleared by the [place] FDA and  has been authorized for detection  and/or diagnosis of SARS-CoV-2 by FDA under an Emergency Use Authorization (EUA). This EUA will remain  in effect (meaning this test can be used) for the duration of the COVID-19 declaration under Section 564(b)(1) of the Act, 21 U.S.C.section 360bbb-3(b)(1), unless the authorization is terminated  or revoked sooner.       Influenza A by PCR NEGATIVE NEGATIVE Final   Influenza B by PCR NEGATIVE NEGATIVE Final    Comment: (NOTE) The Xpert Xpress SARS-CoV-2/FLU/RSV plus assay is intended as an aid in the diagnosis of influenza from Nasopharyngeal swab specimens and should not be used as a sole basis for treatment. Nasal washings and aspirates are unacceptable for Xpert Xpress SARS-CoV-2/FLU/RSV testing.  Fact Sheet for Patients: BloggerCourse.com  Fact Sheet for Healthcare Providers: SeriousBroker.it  This test is not yet approved or cleared by the [place] FDA and has been authorized for detection and/or diagnosis of SARS-CoV-2 by FDA under an Emergency Use Authorization (EUA). This EUA will remain in effect (meaning this test can be used) for the duration of the COVID-19 declaration under Section 564(b)(1) of the Act, 21 U.S.C. section 360bbb-3(b)(1), unless the authorization is terminated or revoked.  Performed at [facility], [address] [place], [place] [address]       Imaging Studies   DG Chest Portable 1 View  Result Date: 06/05/2021 CLINICAL DATA:  Difficulty breathing starting today. Pt on respirator and unable to answer question, could not obtain history. EXAM: PORTABLE CHEST 1 VIEW COMPARISON:  06/27/2019 FINDINGS: Cardiac silhouette is normal in size. No mediastinal or hilar masses. Lungs are hyperexpanded. There is chronic interstitial thickening, most evident at the bases, stable. No evidence of pneumonia or pulmonary edema. No convincing pleural effusion and no pneumothorax. Skeletal structures  are grossly intact. IMPRESSION: 1. No acute cardiopulmonary disease. Electronically Signed   By: Amie Portland M.D.   On: 06/05/2021 10:28     Medications   Scheduled Meds:  aspirin EC  81 mg Oral Daily   cephALEXin  500 mg Oral Q8H   enoxaparin (LOVENOX) injection  30 mg Subcutaneous Q24H   feeding supplement  237 mL Oral BID BM   furosemide  20 mg Intravenous Q12H   losartan  25 mg Oral BID   multivitamin with minerals  1 tablet  Oral Daily   Continuous Infusions:     LOS: 1 day    Time spent: 30 minutes    Pennie Banter, DO [facility]  06/06/2021, 3:24 PM      If 7PM-7AM, please contact night-coverage. How to contact the [facility] Attending or Consulting provider 7A - 7P or covering provider during after hours 7P -7A, for this patient?    Check the care team in [facility] and look for a) attending/consulting [facility] provider listed and b) the [facility] team listed Log into [email or web address] and use [place]'s universal password to access. If you do not have the password, please contact the hospital operator. Locate the [facility] provider you are looking for under [facility] and page to a number that you can be directly reached. If you still have difficulty reaching the provider, please page the [facility] (Director on Call) for the Hospitalists listed on amion for assistance.

## 2021-06-06 NOTE — Progress Notes (Signed)
Was notified by monitors that pts HR was 180, came to the patients room to discover a HR sustaining in the 140's and pt tremoring and c/o chest pain and nausea. BP was 200's/100's. Charge RN was also in the pt room and assisted with stabilizing the patient. An EKG was obtained which revealed sinus tach with PVC's. Hydralazine and zofran was given according to pts PRNs while awaiting for Dr response to Epic secure message update on pt condition. Nitro and morphine was given according to Drs orders, as followed by 5mg  cardizem and magnesium infusion 2mg . See chart for further orders and details. At this time, pt BP is 118/61, HR sustained 120-130. MD orders digoxin IV upon this update, which was given.

## 2021-06-06 NOTE — Progress Notes (Signed)
Pt requested me to call her daughter Manuela Schwartz, which I attempted but was met with a "this number is not in service" message.  Charge RN also tried and received the same. I tried daughter [name] whose phone rang but the call went to voicemail, and had a message that "this number belongs to a voicemail that has not yet been set up".

## 2021-06-06 NOTE — Progress Notes (Signed)
   06/06/21 2204  Assess: MEWS Score  Temp (!) 100.7 F (38.2 C)  BP (!) 172/97  Pulse Rate (!) 134  SpO2 97 %  Assess: MEWS Score  MEWS Temp 1  MEWS Systolic 0  MEWS Pulse 3  MEWS RR 0  MEWS LOC 0  MEWS Score 4  MEWS Score Color Red  Assess: if the MEWS score is Yellow or Red  Were vital signs taken at a resting state? Yes  Focused Assessment Change from prior assessment (see assessment flowsheet)  Does the patient meet 2 or more of the SIRS criteria? No  MEWS guidelines implemented *See Row Information* Yes  Treat  Pain Scale Faces  Faces Pain Scale 8  Pain Type Acute pain  Take Vital Signs  Increase Vital Sign Frequency  Yellow: Q 2hr X 2 then Q 4hr X 2, if remains yellow, continue Q 4hrs  Notify: Charge Nurse/RN  Name of Charge Nurse/RN Notified Vonda Antigua RN  Date Charge Nurse/RN Notified 06/06/21  Time Charge Nurse/RN Notified 2205  Notify: Provider  Provider Name/Title Mansy MD  Date Provider Notified 06/06/21  Time Provider Notified 2206  Notification Type Page (secure chat)  Notification Reason Change in status  Provider response See new orders  Date of Provider Response 06/06/21  Time of Provider Response 2207  Document  Patient Outcome Stabilized after interventions  Progress note created (see row info) Yes  Assess: SIRS CRITERIA  SIRS Temperature  0  SIRS Pulse 1  SIRS Respirations  0  SIRS WBC 0  SIRS Score Sum  1    Entered patient room and found patient bp and hr elevated (see attached) also belching and complaining of nausea. PRN zofran and hydralazine given. Primary nurse also at bedside. Patient complaining of chest pain. Notified Mansy MD and orders placed for EKG, morphine, and SL nitro. Patient's chest pain improved after administration but HR remained elevated. 5mg  IV cardizem ordered with little improvement. 0.25 mg dig ordered. Patient now resting comfortably, HR now 118.

## 2021-06-07 ENCOUNTER — Inpatient Hospital Stay: Payer: Medicare Other

## 2021-06-07 ENCOUNTER — Inpatient Hospital Stay (HOSPITAL_COMMUNITY)
Admit: 2021-06-07 | Discharge: 2021-06-07 | Disposition: A | Discharge status: Home / Self Care | Payer: Medicare Other | Attending: Internal Medicine | Admitting: Internal Medicine

## 2021-06-07 DIAGNOSIS — I5021 Acute systolic (congestive) heart failure: Secondary | ICD-10-CM

## 2021-06-07 DIAGNOSIS — R778 Other specified abnormalities of plasma proteins: Secondary | ICD-10-CM

## 2021-06-07 DIAGNOSIS — R000 Tachycardia, unspecified: Secondary | ICD-10-CM

## 2021-06-07 DIAGNOSIS — I5023 Acute on chronic systolic (congestive) heart failure: Secondary | ICD-10-CM | POA: Diagnosis not present

## 2021-06-07 LAB — CBC
HCT: 37.5 % (ref 36.0–46.0)
Hemoglobin: 12.3 g/dL (ref 12.0–15.0)
MCH: 30.8 pg (ref 26.0–34.0)
MCHC: 32.8 g/dL (ref 30.0–36.0)
MCV: 93.8 fL (ref 80.0–100.0)
Platelets: 135 10*3/uL — ABNORMAL LOW (ref 150–400)
RBC: 4 MIL/uL (ref 3.87–5.11)
RDW: 14.1 % (ref 11.5–15.5)
WBC: 18 10*3/uL — ABNORMAL HIGH (ref 4.0–10.5)
nRBC: 0 % (ref 0.0–0.2)

## 2021-06-07 LAB — ECHOCARDIOGRAM COMPLETE
AR max vel: 1.54 cm2
AV Area VTI: 1.53 cm2
AV Area mean vel: 1.57 cm2
AV Mean grad: 3.7 mmHg
AV Peak grad: 6.4 mmHg
Ao pk vel: 1.26 m/s
Area-P 1/2: 4.6 cm2
Calc EF: 31.5 %
Height: 66 in
S' Lateral: 4.8 cm
Single Plane A2C EF: 31.2 %
Single Plane A4C EF: 26.1 %
Weight: 1555.57 oz

## 2021-06-07 LAB — BASIC METABOLIC PANEL
Anion gap: 10 (ref 5–15)
BUN: 34 mg/dL — ABNORMAL HIGH (ref 8–23)
CO2: 29 mmol/L (ref 22–32)
Calcium: 8.4 mg/dL — ABNORMAL LOW (ref 8.9–10.3)
Chloride: 99 mmol/L (ref 98–111)
Creatinine, Ser: 0.98 mg/dL (ref 0.44–1.00)
GFR, Estimated: 51 mL/min — ABNORMAL LOW (ref >60)
Glucose, Bld: 122 mg/dL — ABNORMAL HIGH (ref 70–99)
Potassium: 3.8 mmol/L (ref 3.5–5.1)
Sodium: 138 mmol/L (ref 135–145)

## 2021-06-07 LAB — MRSA NEXT GEN BY PCR, NASAL: MRSA by PCR Next Gen: NOT DETECTED

## 2021-06-07 LAB — MAGNESIUM: Magnesium: 2.5 mg/dL — ABNORMAL HIGH (ref 1.7–2.4)

## 2021-06-07 LAB — TROPONIN I (HIGH SENSITIVITY): Troponin I (High Sensitivity): 167 ng/L (ref <18)

## 2021-06-07 LAB — LACTIC ACID, PLASMA
Lactic Acid, Venous: 2.6 mmol/L (ref 0.5–1.9)
Lactic Acid, Venous: 1.4 mmol/L (ref 0.5–1.9)

## 2021-06-07 LAB — HEMOGLOBIN A1C
Hgb A1c MFr Bld: 5.5 % (ref 4.8–5.6)
Mean Plasma Glucose: 111 mg/dL

## 2021-06-07 LAB — TSH: TSH: 1.777 u[IU]/mL (ref 0.350–4.500)

## 2021-06-07 MED ORDER — FUROSEMIDE 20 MG PO TABS
20.0000 mg | ORAL_TABLET | Freq: Two times a day (BID) | ORAL | Status: DC
  Administered 2021-06-07 – 2021-06-08 (×2): 20 mg via ORAL
  Filled 2021-06-07 (×2): qty 1

## 2021-06-07 MED ORDER — METOPROLOL TARTRATE 25 MG PO TABS
12.5000 mg | ORAL_TABLET | Freq: Two times a day (BID) | ORAL | Status: DC
  Filled 2021-06-07: qty 1

## 2021-06-07 MED ORDER — SODIUM CHLORIDE 0.9 % IV SOLN
1.0000 g | INTRAVENOUS | Status: DC
  Administered 2021-06-07 – 2021-06-08 (×2): 1 g via INTRAVENOUS
  Filled 2021-06-07 (×2): qty 10
  Filled 2021-06-07: qty 1

## 2021-06-07 MED ORDER — METOPROLOL TARTRATE 5 MG/5ML IV SOLN
2.5000 mg | Freq: Two times a day (BID) | INTRAVENOUS | Status: DC
  Administered 2021-06-07 (×2): 2.5 mg via INTRAVENOUS
  Filled 2021-06-07 (×2): qty 5

## 2021-06-07 NOTE — Progress Notes (Signed)
*  PRELIMINARY RESULTS* Echocardiogram 2D Echocardiogram has been performed.  Cristela Blue 06/07/2021, 8:24 AM

## 2021-06-07 NOTE — Plan of Care (Signed)
Paged by RN re daughter requesting transition to PO lasix this evening. Have switch IV lasix 20 mg BID to 20 mg PO BID for now.

## 2021-06-07 NOTE — Progress Notes (Signed)
Daughter sleeping in recliner at bedside. Pt medicated according to earlier discussion with daughter, who voiced during assessments and rounds that she would feel more comfortable skipping IV lasix for her mother tonight but would accept PO - cardiology was contacted and new orders were put in by Dr Joyce Gross. Pt took evening medications and continued to rest comfortably. Denies further needs at this time, call light in reach, bed locked and low and bed alarm on.

## 2021-06-07 NOTE — Progress Notes (Signed)
PROGRESS NOTE    Patricia Bradford   ZOX:[id number]  DOB: 08-17-1916  PCP: Casilda Carls, MD    DOA: 06/05/2021 LOS: 3    Brief Narrative / Hospital Course to Date:   87 year old female with past medical history of hypertension, systolic CHF with EF of 20 to 25%.  Hard of hearing who presented to the ED on 06/05/2021 with complaints of shortness of breath progressively worsening.  On arrival to the ED patient was in respiratory distress and required BiPAP initially.  She was later weaned off BiPAP and maintaining sats on 2 L/min nasal cannula O2.  Found to have acutely decompensated CHF and admitted to [facility] service undergoing IV diuresis  Assessment & Plan   Principal Problem:   Acute on chronic systolic CHF (congestive heart failure) (HCC) Active Problems:   HTN (hypertension)   Leukocytosis   Elevated troponin   Protein-calorie malnutrition, severe (HCC)  Root acute respiratory failure with hypoxia due to Acute on chronic systolic CHF -prior echo with EF of 20-25%. Responding well to IV Lasix. -- Continue IV Lasix --Supplement oxygen to maintain sats above 90% -- Monitor renal function and electrolytes --Monitor volume status: Daily weights and strict I/O's --Follow-up pending echo --Low-sodium diet with fluid restriction  Severe Sepsis due to Right lower extremity cellulitis -present on admission.  Due to recent cat bite. Pt met severe sepsis on admission with leukocytosis, tachycardia in setting of cellulitis. Sepsis physiology improved and lactic acidosis resolved. Lactic acidosis consistent with organ dysfunction and severe sepsis.  Initially started on Keflex.   Changed to Rocephin after patient became febrile overnight 9/25-26. No proximal tracking of erythema seen. --Continue Rocephin --Follow blood cultures --Monitor RLE erythema for expansion or proximal tracking   Elevated troponin -mild, most likely demand ischemia.  Patient is not having any chest pain and  no acute ischemic changes on EKG.  Continue on aspirin.  Hypertension -continue on home losartan.  IV hydralazine as needed  Leukocytosis -present with WBC 18 point 7K but without any obvious source of infection.  Chest x-ray was negative. 9/25: Patient's right lower extremity appears to have early signs of cellulitis surrounding an area where she was bitten by a cat about 2 days ago.  Severe protein calorie malnutrition -  Patient BMI: Body mass index is 16.08 kg/m.  Nutritional supplement drinks ordered.  DVT prophylaxis: enoxaparin (LOVENOX) injection 30 mg Start: 06/05/21 2200   Diet:  Diet Orders (From admission, onward)     Start     Ordered   06/05/21 1950  Diet 2 gram sodium Room service appropriate? Yes; Fluid consistency: Thin  Diet effective now       Question Answer Comment  Room service appropriate? Yes   Fluid consistency: Thin      06/05/21 1949              Code Status: DNR   Subjective 06/08/21    Patient awake sitting up in bed when seen on rounds this morning.  Daughter was at bedside.  Pt had fevers up to 101.7 overnight.  She denies fever/chills today.  No SOB, CP or other complaints. Denies pain in her leg.   Disposition Plan & Communication   Status is: Inpatient  Remains inpatient appropriate because: On IV antibiotics for cellulitis after new onset fevers overnight.  Dispo: The patient is from: Home              Anticipated d/c is to: Home  Patient currently is not medically stable to d/c.   Difficult to place patient No   Family Communication: Daughter at bedside on rounds today 9/26   Consults, Procedures, Significant Events   Consultants:  None  Procedures:  None  Antimicrobials:  Anti-infectives (From admission, onward)    Start     Dose/Rate Route Frequency Ordered Stop   06/07/21 0945  cefTRIAXone (ROCEPHIN) 1 g in sodium chloride 0.9 % 100 mL IVPB        1 g 200 mL/hr over 30 Minutes Intravenous Every 24  hours 06/07/21 0854 06/14/21 0944   06/06/21 1400  cephALEXin (KEFLEX) capsule 500 mg  Status:  Discontinued        500 mg Oral Every 8 hours 06/06/21 0947 06/07/21 0854         Micro    Objective   Vitals:   06/07/21 1936 06/08/21 0016 06/08/21 0431 06/08/21 0747  BP: (!) 96/43 (!) 110/44 (!) 92/41 (!) 100/55  Pulse: 62 76 72 77  Resp: _0 Temp: 97.8 F (36.6 C) 98.5 F (36.9 C) 98.7 F (37.1 C) 98 F (36.7 C)  TempSrc: Oral Oral Oral   SpO2: 93% 91% 93% 92%  Weight:   45.2 kg   Height:        Intake/Output Summary (Last 24 hours) at 06/08/2021 0828 Last data filed at 06/08/2021 0747 Gross per 24 hour  Intake 720 ml  Output 1400 ml  Net -680 ml   Filed Weights   06/05/21 1002 06/06/21 0605 06/08/21 0431  Weight: 45 kg 44.1 kg 45.2 kg    Physical Exam:  General exam: awake, alert, no acute distress HEENT: moist mucus membranes, hard of hearing  Respiratory system: CTAB, no wheezes, rales or rhonchi, normal respiratory effort. Cardiovascular system: normal S1/S2, RRR, no JVD, murmurs, rubs, gallops, no pedal edema.   Gastrointestinal system: soft, NT, ND, no HSM felt, +bowel sounds. Central nervous system: no gross focal neurologic deficits, normal speech Extremities: Right medial calf with small skin tear/wound without drainage and improvement in surrounding erythema, differential warmth resolved, no tracking of erythema proximally Psychiatry: normal mood, congruent affect  Labs   Data Reviewed: I have personally reviewed following labs and imaging studies  CBC: Recent Labs  Lab 06/05/21 1010 06/06/21 0614 06/07/21 0710  WBC 18.7* 14.6* 18.0*  NEUTROABS 12.5*  --   --   HGB 13.3 11.6* 12.3  HCT 40.5 34.6* 37.5  MCV 95.7 93.8 93.8  PLT 141* 139* 419*   Basic Metabolic Panel: Recent Labs  Lab 06/05/21 1010 06/06/21 0614 06/07/21 0710  NA 139 137 138  K 3.9 4.2 3.8  CL 101 101 99  CO2 _1 GLUCOSE 168* 111* 122*  BUN 20 22 34*   CREATININE 0.61 0.77 0.98  CALCIUM 8.7* 8.5* 8.4*  MG  --  1.8 2.5*   GFR: Estimated Creatinine Clearance: 20.1 mL/min (by C-G formula based on SCr of 0.98 mg/dL). Liver Function Tests: Recent Labs  Lab 06/05/21 1010  AST 24  ALT 13  ALKPHOS 59  BILITOT 0.8  PROT 6.6  ALBUMIN 3.9   No results for input(s): LIPASE, AMYLASE in the last 168 hours. No results for input(s): AMMONIA in the last 168 hours. Coagulation Profile: No results for input(s): INR, PROTIME in the last 168 hours. Cardiac Enzymes: No results for input(s): CKTOTAL, CKMB, CKMBINDEX, TROPONINI in the last 168 hours. BNP (last 3 results) No results for input(s): PROBNP in  the last 8760 hours. HbA1C: Recent Labs    06/05/21 1220  HGBA1C 5.5   CBG: No results for input(s): GLUCAP in the last 168 hours. Lipid Profile: Recent Labs    06/06/21 0614  CHOL 153  HDL 70  LDLCALC 76  TRIG 33  CHOLHDL 2.2   Thyroid Function Tests: Recent Labs    06/07/21 0710  TSH 1.777   Anemia Panel: No results for input(s): VITAMINB12, FOLATE, FERRITIN, TIBC, IRON, RETICCTPCT in the last 72 hours. Sepsis Labs: Recent Labs  Lab 06/07/21 0857 06/07/21 1217  LATICACIDVEN 2.6* 1.4    Recent Results (from the past 240 hour(s))  Resp Panel by RT-PCR (Flu A&B, Covid) Nasopharyngeal Swab     Status: None   Collection Time: 06/05/21 10:11 AM   Specimen: Nasopharyngeal Swab; Nasopharyngeal(NP) swabs in vial transport medium  Result Value Ref Range Status   SARS Coronavirus 2 by RT PCR NEGATIVE NEGATIVE Final    Comment: (NOTE) SARS-CoV-2 target nucleic acids are NOT DETECTED.  The SARS-CoV-2 RNA is generally detectable in upper respiratory specimens during the acute phase of infection. The lowest concentration of SARS-CoV-2 viral copies this assay can detect is 138 copies/mL. A negative result does not preclude SARS-Cov-2 infection and should not be used as the sole basis for treatment or other patient management  decisions. A negative result may occur with  improper specimen collection/handling, submission of specimen other than nasopharyngeal swab, presence of viral mutation(s) within the areas targeted by this assay, and inadequate number of viral copies(<138 copies/mL). A negative result must be combined with clinical observations, patient history, and epidemiological information. The expected result is Negative.  Fact Sheet for Patients:  EntrepreneurPulse.com.au  Fact Sheet for Healthcare Providers:  IncredibleEmployment.be  This test is no t yet approved or cleared by the [place] FDA and  has been authorized for detection and/or diagnosis of SARS-CoV-2 by FDA under an Emergency Use Authorization (EUA). This EUA will remain  in effect (meaning this test can be used) for the duration of the COVID-19 declaration under Section 564(b)(1) of the Act, 21 U.S.C.section 360bbb-3(b)(1), unless the authorization is terminated  or revoked sooner.       Influenza A by PCR NEGATIVE NEGATIVE Final   Influenza B by PCR NEGATIVE NEGATIVE Final    Comment: (NOTE) The Xpert Xpress SARS-CoV-2/FLU/RSV plus assay is intended as an aid in the diagnosis of influenza from Nasopharyngeal swab specimens and should not be used as a sole basis for treatment. Nasal washings and aspirates are unacceptable for Xpert Xpress SARS-CoV-2/FLU/RSV testing.  Fact Sheet for Patients: EntrepreneurPulse.com.au  Fact Sheet for Healthcare Providers: IncredibleEmployment.be  This test is not yet approved or cleared by the [place] FDA and has been authorized for detection and/or diagnosis of SARS-CoV-2 by FDA under an Emergency Use Authorization (EUA). This EUA will remain in effect (meaning this test can be used) for the duration of the COVID-19 declaration under Section 564(b)(1) of the Act, 21 U.S.C. section 360bbb-3(b)(1), unless the  authorization is terminated or revoked.  Performed at [facility], [address] [place], [place] [address]   CULTURE, BLOOD (ROUTINE X 2) w Reflex to ID Panel     Status: None (Preliminary result)   Collection Time: 06/07/21  8:57 AM   Specimen: BLOOD  Result Value Ref Range Status   Specimen Description BLOOD BLOOD LEFT HAND  Final   Special Requests   Final    BOTTLES DRAWN AEROBIC AND ANAEROBIC Blood Culture adequate  volume   Culture   Final    NO GROWTH < 24 HOURS Performed at [facility], [address] [place], [place] [address]    Report Status PENDING  Incomplete  CULTURE, BLOOD (ROUTINE X 2) w Reflex to ID Panel     Status: None (Preliminary result)   Collection Time: 06/07/21  9:13 AM   Specimen: BLOOD  Result Value Ref Range Status   Specimen Description BLOOD BLOOD RIGHT HAND  Final   Special Requests   Final    BOTTLES DRAWN AEROBIC AND ANAEROBIC Blood Culture adequate volume   Culture   Final    NO GROWTH < 24 HOURS Performed at [facility], [address], [place], [place] [address]    Report Status PENDING  Incomplete  MRSA Next Gen by PCR, Nasal     Status: None   Collection Time: 06/07/21 12:49 PM   Specimen: Nasal Mucosa; Nasal Swab  Result Value Ref Range Status   MRSA by PCR Next Gen NOT DETECTED NOT DETECTED Final    Comment: (NOTE) The GeneXpert MRSA Assay (FDA approved for NASAL specimens only), is one component of a comprehensive MRSA colonization surveillance program. It is not intended to diagnose MRSA infection nor to guide or monitor treatment for MRSA infections. Test performance is not FDA approved in patients less than 100 years old. Performed at [facility], [address] [place], [place] [address]       Imaging Studies   DG Chest Helper 1 View  Result Date: 06/07/2021 CLINICAL DATA:  Fever. EXAM: PORTABLE CHEST 1 VIEW COMPARISON:  06/05/2021 FINDINGS: Coarse lung markings are unchanged.  No focal airspace disease. No overt pulmonary edema. Heart and mediastinum are within normal limits. Atherosclerotic calcifications at the aortic arch. Negative for a pneumothorax. IMPRESSION: No active disease. Electronically Signed   By: Markus Daft M.D.   On: 06/07/2021 12:08   ECHOCARDIOGRAM COMPLETE  Result Date: 06/07/2021    ECHOCARDIOGRAM REPORT   Patient Name:   Patricia Bradford Date of Exam: 06/07/2021 Medical Rec #:  [id number]      Height:       66.0 in Accession #:    [id number]     Weight:       97.2 lb Date of Birth:  [birth year]       BSA:          1.472 m Patient Age:    89 years      BP:           125/63 mmHg Patient Gender: F              HR:           101 bpm. Exam Location:  [facility] Procedure: 2D Echo, Cardiac Doppler and Color Doppler Indications:     CHF-acute systolic P95.09  History:         Patient has prior history of Echocardiogram examinations, most                  recent 06/28/2019. CHF; Risk Factors:Hypertension.  Sonographer:     Sherrie Sport Referring Phys:  Driscoll Diagnosing Phys: Kathlyn Sacramento MD IMPRESSIONS  1. Left ventricular ejection fraction, by estimation, is 20 to 25%. The left ventricle has severely decreased function. The left ventricle demonstrates global hypokinesis. The left ventricular internal cavity size was mildly dilated. Left ventricular diastolic parameters are consistent with Grade I diastolic dysfunction (impaired relaxation).  2. Right ventricular  systolic function is normal. The right ventricular size is normal. Tricuspid regurgitation signal is inadequate for assessing PA pressure.  3. The mitral valve is normal in structure. Mild mitral valve regurgitation. No evidence of mitral stenosis. Moderate mitral annular calcification.  4. The aortic valve is normal in structure. Aortic valve regurgitation is not visualized. Mild to moderate aortic valve sclerosis/calcification is present, without any evidence of aortic stenosis.  5. The inferior vena cava is  normal in size with greater than 50% respiratory variability, suggesting right atrial pressure of 3 mmHg. FINDINGS  Left Ventricle: Left ventricular ejection fraction, by estimation, is 20 to 25%. The left ventricle has severely decreased function. The left ventricle demonstrates global hypokinesis. The left ventricular internal cavity size was mildly dilated. There is no left ventricular hypertrophy. Left ventricular diastolic parameters are consistent with Grade I diastolic dysfunction (impaired relaxation). Right Ventricle: The right ventricular size is normal. No increase in right ventricular wall thickness. Right ventricular systolic function is normal. Tricuspid regurgitation signal is inadequate for assessing PA pressure. The tricuspid regurgitant velocity is 2.52 m/s, and with an assumed right atrial pressure of 3 mmHg, the estimated right ventricular systolic pressure is 54.2 mmHg. (TR signal likely not accurate) Left Atrium: Left atrial size was normal in size. Right Atrium: Right atrial size was normal in size. Pericardium: There is no evidence of pericardial effusion. Mitral Valve: The mitral valve is normal in structure. Moderate mitral annular calcification. Mild mitral valve regurgitation. No evidence of mitral valve stenosis. Tricuspid Valve: The tricuspid valve is normal in structure. Tricuspid valve regurgitation is trivial. No evidence of tricuspid stenosis. Aortic Valve: The aortic valve is normal in structure. Aortic valve regurgitation is not visualized. Mild to moderate aortic valve sclerosis/calcification is present, without any evidence of aortic stenosis. Aortic valve mean gradient measures 3.7 mmHg. Aortic valve peak gradient measures 6.4 mmHg. Aortic valve area, by VTI measures 1.53 cm. Pulmonic Valve: The pulmonic valve was normal in structure. Pulmonic valve regurgitation is trivial. No evidence of pulmonic stenosis. Aorta: The aortic root is normal in size and structure. Venous: The  inferior vena cava is normal in size with greater than 50% respiratory variability, suggesting right atrial pressure of 3 mmHg. IAS/Shunts: No atrial level shunt detected by color flow Doppler.  LEFT VENTRICLE PLAX 2D LVIDd:         5.40 cm      Diastology LVIDs:         4.80 cm      LV e' medial:    2.28 cm/s LV PW:         1.30 cm      LV E/e' medial:  19.9 LV IVS:        0.65 cm      LV e' lateral:   2.72 cm/s LVOT diam:     2.00 cm      LV E/e' lateral: 16.7 LV SV:         36 LV SV Index:   24 LVOT Area:     3.14 cm  LV Volumes (MOD) LV vol d, MOD A2C: 105.0 ml LV vol d, MOD A4C: 102.0 ml LV vol s, MOD A2C: 72.2 ml LV vol s, MOD A4C: 75.4 ml LV SV MOD A2C:     32.8 ml LV SV MOD A4C:     102.0 ml LV SV MOD BP:      34.2 ml RIGHT VENTRICLE RV Basal diam:  2.60 cm RV S prime:  13.70 cm/s TAPSE (M-mode): 3.6 cm LEFT ATRIUM             Index       RIGHT ATRIUM           Index LA diam:        3.40 cm 2.31 cm/m  RA Area:     11.40 cm LA Vol (A2C):   57.4 ml 39.00 ml/m RA Volume:   23.00 ml  15.63 ml/m LA Vol (A4C):   38.5 ml 26.16 ml/m LA Biplane Vol: 48.9 ml 33.22 ml/m  AORTIC VALVE                   PULMONIC VALVE AV Area (Vmax):    1.54 cm    PV Vmax:        0.74 m/s AV Area (Vmean):   1.57 cm    PV Peak grad:   2.2 mmHg AV Area (VTI):     1.53 cm    RVOT Peak grad: 2 mmHg AV Vmax:           126.33 cm/s AV Vmean:          91.533 cm/s AV VTI:            0.234 m AV Peak Grad:      6.4 mmHg AV Mean Grad:      3.7 mmHg LVOT Vmax:         62.00 cm/s LVOT Vmean:        45.600 cm/s LVOT VTI:          0.114 m LVOT/AV VTI ratio: 0.49  AORTA Ao Root diam: 3.00 cm MITRAL VALVE               TRICUSPID VALVE MV Area (PHT): 4.60 cm    TR Peak grad:   25.4 mmHg MV Decel Time: 165 msec    TR Vmax:        252.00 cm/s MV E velocity: 45.40 cm/s MV A velocity: 86.10 cm/s  SHUNTS MV E/A ratio:  0.53        Systemic VTI:  0.11 m                            Systemic Diam: 2.00 cm Kathlyn Sacramento MD Electronically signed by  Kathlyn Sacramento MD Signature Date/Time: 06/07/2021/9:23:31 AM    Final      Medications   Scheduled Meds:  aspirin EC  81 mg Oral Daily   enoxaparin (LOVENOX) injection  30 mg Subcutaneous Q24H   feeding supplement  237 mL Oral BID BM   furosemide  20 mg Oral BID   losartan  25 mg Oral BID   metoprolol tartrate  12.5 mg Oral BID   multivitamin with minerals  1 tablet Oral Daily   Continuous Infusions:  cefTRIAXone (ROCEPHIN)  IV 1 g (06/07/21 1511)       LOS: 3 days    Time spent: 30 minutes    Ezekiel Slocumb, DO [facility]  06/08/2021, 8:28 AM      If 7PM-7AM, please contact night-coverage. How to contact the [facility] Attending or Consulting provider [place] or covering provider during after hours [place], for this patient?    Check the care team in [facility] and look for a) attending/consulting [facility] provider listed and b) the [facility] team listed Log into [email or web address] and use [place]'s universal password to access. If you do not  have the password, please contact the hospital operator. Locate the [facility] provider you are looking for under [facility] and page to a number that you can be directly reached. If you still have difficulty reaching the provider, please page the [facility] (Director on Call) for the Hospitalists listed on amion for assistance.

## 2021-06-07 NOTE — Consult Note (Signed)
Cardiology Consultation:   Patient ID: Patricia Bradford; [id number]; 1916/03/16   Admit date: 06/05/2021 Date of Consult: 06/07/2021  Primary Care Provider: Sherrie Mustache, MD Primary Cardiologist: [facility]) - new to [facility], consult by [facility] Primary Electrophysiologist:  None   Patient Profile:   Patricia Bradford is a 87 y.o. female with a hx of HFrEF presumed to be dilated NICM (no prior ischemic evaluation for review), new onset Afib with RVR, HTN, pulmonary nodules, underweight who is being seen today for the evaluation of new onset Afib with RVR, acute on chronic HFrEF and elevated troponin at the request of Dr. Denton Lank.  History of Present Illness:   Patricia Bradford was diagnosed with HFrEF in 06/2019 during hospital admission with echo at that time showing an EF of 20-25%, global hypokinesis, diastolic dysfunction, normal RV ssytolic function and ventricular cavity size, normal size left atrium, and mild to moderate mitral regurgitation.  She was conservatively managed by the primary service at that time.   She was admitted to the hospital in [facility] in 12/2020 with acute on chronic HFrEF in the setting of hypertensive crisis. Echo at that time showed an EF of ~ 20-30%, global hypokinesis with septal wall motion abnormality due to LBBB, Gr1DD, mild left atrial dilatation, normal RVSF and ventricular cavity size, and no significant valvular abnormalities. SYmptoms improved with diuresis. Per cardiology during that admission, she was not candidate for invasive procedure nor was she a candidate for escalation of GDMT given advanced age and marginal BP. Notes do not indicate Afib was present at that time. Discharge weight of 90 pounds.   She was admitted to [facility] on 9/24 with progressive SOB with BP peaking at 207 mmHg systolic. Initial EKG showed sinus rhythm. CXR showed no acute cardiopulmonary process. High sensitivity troponin 38 with a delta of 114, peaking at 178 with subsequent down  trend. BNP 1448. She was initially treated for acute on chronic HFrEF with IV Lasix. On 9/25, at 21:59, she developed narrow complex tachycardia with rates in the 170s to 180 bpm consistent with Afib with RVR, lasting until 22:01. Following this episode, shew as given 5 mg of IV diltiazem and 0.25 mg of IV digoxin. T-max 101.7. Repeat CXR is pending. High sensitivity troponin was repeated with a peak of 167. Cultures pending. Currently, maintaining sinus rhythm.  She indicates her dyspnea is largely unchanged.  No chest pain, palpitations, or lower extremity swelling.  Repeat echo this admission showed a persistent cardiomyopathy with an EF of 20 to 25%, global hypokinesis, mildly dilated internal LV cavity size, grade 1 diastolic dysfunction, normal RV systolic function and ventricular cavity size, mild mitral regurgitation, mild to moderate aortic valve sclerosis without evidence of stenosis, and an estimated right atrial pressure of 3 mmHg.  Past Medical History:  Diagnosis Date   Chronic systolic congestive heart failure (HCC)    HTN (hypertension)     Past Surgical History:  Procedure Laterality Date   kidney stone N/A      Home Meds: Prior to Admission medications   Medication Sig Start Date End Date Taking? Authorizing Provider  aspirin EC 81 MG tablet Take 81 mg by mouth daily.   Yes [provider]  Cranberry 400 MG CAPS Take 400 mg by mouth daily.   Yes [provider]  furosemide (LASIX) 40 MG tablet Take 40 mg by mouth See admin instructions. Take 1 tablet (40mg ) by mouth every Friday and 1 tablet (40mg ) by mouth  daily as needed for excess fluid weight   Yes [provider]  losartan (COZAAR) 25 MG tablet Take 1 tablet (25 mg total) by mouth daily. Patient taking differently: Take 25 mg by mouth 2 (two) times daily. 06/30/19  Yes Enedina Finner, MD  Multiple Vitamin (MULTI-VITAMIN) tablet Take 1 tablet by mouth daily.   Yes [provider]  feeding  supplement, ENSURE ENLIVE, (ENSURE ENLIVE) LIQD Take 237 mLs by mouth 2 (two) times daily between meals. 06/30/19   Enedina Finner, MD  furosemide (LASIX) 20 MG tablet Take 1 tablet (20 mg total) by mouth every other day. Patient not taking: No sig reported 06/30/19   Enedina Finner, MD    Inpatient Medications: Scheduled Meds:  aspirin EC  81 mg Oral Daily   enoxaparin (LOVENOX) injection  30 mg Subcutaneous Q24H   feeding supplement  237 mL Oral BID BM   furosemide  20 mg Intravenous Q12H   losartan  25 mg Oral BID   metoprolol tartrate  2.5 mg Intravenous Q12H   multivitamin with minerals  1 tablet Oral Daily   Continuous Infusions:  cefTRIAXone (ROCEPHIN)  IV     PRN Meds: acetaminophen, albuterol, dextromethorphan-guaiFENesin, hydrALAZINE, ibuprofen, labetalol, morphine injection, nitroGLYCERIN, ondansetron (ZOFRAN) IV  Allergies:  No Known Allergies  Social History:   Social History   Socioeconomic History   Marital status: Widowed    Spouse name: Not on file   Number of children: Not on file   Years of education: Not on file   Highest education level: Not on file  Occupational History   Not on file  Tobacco Use   Smoking status: Never   Smokeless tobacco: Never  Substance and Sexual Activity   Alcohol use: Never   Drug use: Never   Sexual activity: Not on file  Other Topics Concern   Not on file  Social History Narrative   Not on file   Social Determinants of Health   Financial Resource Strain: Not on file  Food Insecurity: Not on file  Transportation Needs: Not on file  Physical Activity: Not on file  Stress: Not on file  Social Connections: Not on file  Intimate Partner Violence: Not on file     Family History:   Family History  Problem Relation Age of Onset   Colon cancer Sister     ROS:  Review of Systems  Constitutional:  Positive for malaise/fatigue. Negative for chills, diaphoresis, fever and weight loss.  HENT:  Negative for congestion.    Eyes:  Negative for discharge and redness.  Respiratory:  Positive for shortness of breath. Negative for cough, hemoptysis, sputum production and wheezing.   Cardiovascular:  Negative for chest pain, palpitations, orthopnea, claudication, leg swelling and PND.  Gastrointestinal:  Negative for abdominal pain, heartburn, nausea and vomiting.  Musculoskeletal:  Negative for falls and myalgias.  Skin:  Negative for rash.  Neurological:  Positive for weakness. Negative for dizziness, tingling, tremors, sensory change, speech change, focal weakness and loss of consciousness.  Endo/Heme/Allergies:  Does not bruise/bleed easily.  Psychiatric/Behavioral:  Negative for substance abuse. The patient is not nervous/anxious.   All other systems reviewed and are negative.    Physical Exam/Data:   Vitals:   06/07/21 0101 06/07/21 0155 06/07/21 0200 06/07/21 0300  BP: (!) 122/55 (!) 120/56 (!) 113/57 125/63  Pulse: (!) 109 99 98 (!) 101  Resp:      Temp: 99.1 F (37.3 C)  99.4 F (37.4 C) 98.9 F (37.2  C)  TempSrc: Oral   Oral  SpO2: 96%  94% 94%  Weight:      Height:        Intake/Output Summary (Last 24 hours) at 06/07/2021 0857 Last data filed at 06/06/2021 1840 Gross per 24 hour  Intake 600 ml  Output --  Net 600 ml   Filed Weights   06/05/21 1002 06/06/21 0605  Weight: 45 kg 44.1 kg   Body mass index is 15.69 kg/m.   Physical Exam: General: Elderly and frail-appearing, in no acute distress. Head: Normocephalic, atraumatic, sclera non-icteric, no xanthomas, nares without discharge.  Neck: Negative for carotid bruits. JVD not elevated. Lungs: Faint crackles noted along the bilateral bases. Breathing is unlabored. Heart: RRR with S1 S2. I/VI systolic murmur RUSB, no rubs, or gallops appreciated. Abdomen: Soft, non-tender, non-distended with normoactive bowel sounds. No hepatomegaly. No rebound/guarding. No obvious abdominal masses. Msk:  Strength and tone appear normal for  age. Extremities: No clubbing or cyanosis. No edema. Distal pedal pulses are 2+ and equal bilaterally. Neuro: Alert and oriented X 3. No facial asymmetry. No focal deficit. Moves all extremities spontaneously. Psych:  Responds to questions appropriately with a normal affect.   EKG:  The EKG was personally reviewed and demonstrates: 06/07/2021 - sinus tachycardia, 110 bpm, nonspecific IVCD. 06/06/2021 - sinus tachycardia, 137 bpm, nonspecific IVCD, PVC. 06/06/2021 - sinus tahcycardia, 135 bpm, nonspecific IVCD Telemetry:  Telemetry was personally reviewed and demonstrates: Sinus rhythm with brief episode of narrow complex irregular tachycardic rhythm consistent with A. fib with RVR beginning on 9/25 at 21:59 and persisting until 22:01.  Sinus rhythm since.  Rare PVC.  Weights: Filed Weights   06/05/21 1002 06/06/21 0605  Weight: 45 kg 44.1 kg    Relevant CV Studies:  2D echo 06/2019: 1. Left ventricular ejection fraction, by visual estimation, is 20 to  25%. The left ventricle has severely decreased function. Normal left  ventricular size. There is no left ventricular hypertrophy.Global  hypokinesis.   2. Left ventricular diastolic Doppler parameters are consistent with  pseudonormalization pattern of LV diastolic filling.   3. Global right ventricle has normal systolic function.The right  ventricular size is normal. No increase in right ventricular wall  thickness.   4. Left atrial size was normal.   5. Mild to moderate mitral valve regurgitation.   6. TR signal is inadequate for assessing pulmonary artery systolic  pressure.  __________  2D echo 12/2020 [facility]): Mildly dilated left ventricle with severe systolic dysfunction.    Left ventricular ejection fraction estimated at 29% by 3D;  20% by  Simpson's biplane.  Global hypokinesis with paradoxical septal motion due to left bundle  branch block.  Normal left ventricular wall wall thickness.  Grade I diastolic dysfunction  (Impaired relaxation) with elevated  LA  pressure.  Mild left atrial dilation.  Normal right ventricular size and systolic function.  No significant valvular abnormalities.  Normal central venous pressure.  Indeterminate PA systolic pressure due to  lack of TR jet.  No prior studies for comparison.  __________  2D echo 06/07/2021: Pending  Laboratory Data:  Chemistry Recent Labs  Lab 06/05/21 1010 06/06/21 0614 06/07/21 0710  NA 139 137 138  K 3.9 4.2 3.8  CL 101 101 99  CO2 28 28 29   GLUCOSE 168* 111* 122*  BUN 20 22 34*  CREATININE 0.61 0.77 0.98  CALCIUM 8.7* 8.5* 8.4*  GFRNONAA >60 >60 51*  ANIONGAP 10 8 10     Recent Labs  Lab 06/05/21 1010  PROT 6.6  ALBUMIN 3.9  AST 24  ALT 13  ALKPHOS 59  BILITOT 0.8   Hematology Recent Labs  Lab 06/05/21 1010 06/06/21 0614 06/07/21 0710  WBC 18.7* 14.6* 18.0*  RBC 4.23 3.69* 4.00  HGB 13.3 11.6* 12.3  HCT 40.5 34.6* 37.5  MCV 95.7 93.8 93.8  MCH 31.4 31.4 30.8  MCHC 32.8 33.5 32.8  RDW 13.5 13.8 14.1  PLT 141* 139* 135*   Cardiac EnzymesNo results for input(s): TROPONINI in the last 168 hours. No results for input(s): TROPIPOC in the last 168 hours.  BNP Recent Labs  Lab 06/05/21 1010  BNP 1,448.1*    DDimer No results for input(s): DDIMER in the last 168 hours.  Radiology/Studies:  DG Chest Portable 1 View  Result Date: 06/05/2021 IMPRESSION: 1. No acute cardiopulmonary disease. Electronically Signed   By: Amie Portland M.D.   On: 06/05/2021 10:28    Assessment and Plan:   1. Acute on chronic HFrEF: -Continue IV Lasix with KCl repletion as indicated -Presumed to be dilated NICM per [facility] cardiology note -Not a candidate for ischemic evaluation or advanced therapies such as ICD/CRT-D given advanced age and DNR status -Echo is pending, though unlikely to change management at this time  2. New onset Afib with RVR: -Brief episode on the evening of 9/25 lasting ~ 2 minutes, no episodes  since -Likely in the setting of her acute illness and volume overload -CHADS2VASc at least 5 (CHF, HTN, age x 2, sex category) -With brief episode occurring during acute illness and increased fall risk, would defer OAC at this time as risks appear to outweigh benefit, family agrees -Check TSH -Magnesium at goal -Potassium normal -Transition IV metoprolol pushes to oral Lopressor   3. Elevated high sensitivity troponin: -Mild elevation, peaking at 178, not consistent with ACS -No indication for heparin gtt at this time without dynamic elevation -She does have a known cardiomyopathy as outlined above, without prior ischemic evaluation for review -Not a candidate for LHC given advanced age (documented at [facility] as well), family agrees -ASA  4.  Fever/cellulitis: -Management per internal medicine   For questions or updates, please contact [facility] Please consult [email or web address] for contact info under Cardiology/STEMI.   Signed, Eula Listen, PA-C [facility] Pager: [phone] 06/07/2021, 8:57 AM

## 2021-06-08 DIAGNOSIS — I5023 Acute on chronic systolic (congestive) heart failure: Secondary | ICD-10-CM | POA: Diagnosis not present

## 2021-06-08 LAB — CBC
HCT: 34.6 % — ABNORMAL LOW (ref 36.0–46.0)
Hemoglobin: 11.7 g/dL — ABNORMAL LOW (ref 12.0–15.0)
MCH: 32.1 pg (ref 26.0–34.0)
MCHC: 33.8 g/dL (ref 30.0–36.0)
MCV: 94.8 fL (ref 80.0–100.0)
Platelets: 136 10*3/uL — ABNORMAL LOW (ref 150–400)
RBC: 3.65 MIL/uL — ABNORMAL LOW (ref 3.87–5.11)
RDW: 14.1 % (ref 11.5–15.5)
WBC: 9.7 10*3/uL (ref 4.0–10.5)
nRBC: 0 % (ref 0.0–0.2)

## 2021-06-08 LAB — BASIC METABOLIC PANEL
Anion gap: 11 (ref 5–15)
BUN: 40 mg/dL — ABNORMAL HIGH (ref 8–23)
CO2: 29 mmol/L (ref 22–32)
Calcium: 8 mg/dL — ABNORMAL LOW (ref 8.9–10.3)
Chloride: 97 mmol/L — ABNORMAL LOW (ref 98–111)
Creatinine, Ser: 0.91 mg/dL (ref 0.44–1.00)
GFR, Estimated: 55 mL/min — ABNORMAL LOW (ref >60)
Glucose, Bld: 92 mg/dL (ref 70–99)
Potassium: 4.2 mmol/L (ref 3.5–5.1)
Sodium: 137 mmol/L (ref 135–145)

## 2021-06-08 MED ORDER — CEFDINIR 300 MG PO CAPS: 300.0000 mg | ORAL_CAPSULE | Freq: Two times a day (BID) | ORAL | 0 refills | Status: AC

## 2021-06-08 MED ORDER — LOSARTAN POTASSIUM 25 MG PO TABS
12.5000 mg | ORAL_TABLET | Freq: Two times a day (BID) | ORAL | 1 refills | Status: DC
Start: 2021-06-08 — End: 2022-04-06

## 2021-06-08 MED ORDER — FUROSEMIDE 20 MG PO TABS: 20.0000 mg | ORAL_TABLET | ORAL | 2 refills | Status: DC

## 2021-06-08 MED ORDER — LOSARTAN POTASSIUM 25 MG PO TABS: 12.5000 mg | ORAL_TABLET | Freq: Two times a day (BID) | ORAL | Status: DC

## 2021-06-08 MED ORDER — METOPROLOL SUCCINATE ER 25 MG PO TB24: 12.5000 mg | ORAL_TABLET | Freq: Every day | ORAL | 1 refills | Status: DC

## 2021-06-08 MED ORDER — FUROSEMIDE 20 MG PO TABS
20.0000 mg | ORAL_TABLET | ORAL | Status: DC
  Filled 2021-06-08: qty 1

## 2021-06-08 MED ORDER — METOPROLOL SUCCINATE ER 25 MG PO TB24: 12.5000 mg | ORAL_TABLET | Freq: Every day | ORAL | Status: DC

## 2021-06-08 NOTE — Progress Notes (Signed)
Progress Note  Patient Name: Patricia Bradford Date of Encounter: 06/08/2021  Primary Cardiologist: [facility]) - new to [facility], consult by Kirke Corin  Subjective   Transitioned from IV Lasix to oral Lasix last evening at family request. Ambulated yesterday without issues. No chest pain, dyspnea, palpitations, dizziness, presyncope or syncope.   Inpatient Medications    Scheduled Meds:  aspirin EC  81 mg Oral Daily   enoxaparin (LOVENOX) injection  30 mg Subcutaneous Q24H   feeding supplement  237 mL Oral BID BM   furosemide  20 mg Oral BID   losartan  25 mg Oral BID   metoprolol tartrate  12.5 mg Oral BID   multivitamin with minerals  1 tablet Oral Daily   Continuous Infusions:  cefTRIAXone (ROCEPHIN)  IV 1 g (06/07/21 1511)   PRN Meds: acetaminophen, albuterol, dextromethorphan-guaiFENesin, hydrALAZINE, ibuprofen, labetalol, morphine injection, nitroGLYCERIN, ondansetron (ZOFRAN) IV   Vital Signs    Vitals:   06/07/21 1531 06/07/21 1936 06/08/21 0016 06/08/21 0431  BP: (!) 100/52 (!) 96/43 (!) 110/44 (!) 92/41  Pulse:  62 76 72  Resp:  16 16 16   Temp:  97.8 F (36.6 C) 98.5 F (36.9 C) 98.7 F (37.1 C)  TempSrc:  Oral Oral Oral  SpO2:  93% 91% 93%  Weight:    45.2 kg  Height:        Intake/Output Summary (Last 24 hours) at 06/08/2021 0711 Last data filed at 06/07/2021 1838 Gross per 24 hour  Intake 720 ml  Output 1400 ml  Net -680 ml   Filed Weights   06/05/21 1002 06/06/21 0605 06/08/21 0431  Weight: 45 kg 44.1 kg 45.2 kg    Telemetry    SR with PACs, 9 beats of NSVT - Personally Reviewed  ECG    No new tracings - Personally Reviewed  Physical Exam   GEN: Elderly and frail appearing.   Neck: No JVD. Cardiac: RRR, I/VI systolic murmur RUSB, rubs, or gallops.  Respiratory: Clear to auscultation bilaterally.  GI: Soft, nontender, non-distended.   MS: No edema; No deformity. Neuro:  Alert and oriented x 3; Nonfocal.  Psych: Normal  affect.  Labs    Chemistry Recent Labs  Lab 06/05/21 1010 06/06/21 0614 06/07/21 0710  NA 139 137 138  K 3.9 4.2 3.8  CL 101 101 99  CO2 28 28 29   GLUCOSE 168* 111* 122*  BUN 20 22 34*  CREATININE 0.61 0.77 0.98  CALCIUM 8.7* 8.5* 8.4*  PROT 6.6  --   --   ALBUMIN 3.9  --   --   AST 24  --   --   ALT 13  --   --   ALKPHOS 59  --   --   BILITOT 0.8  --   --   GFRNONAA >60 >60 51*  ANIONGAP 10 8 10      Hematology Recent Labs  Lab 06/05/21 1010 06/06/21 0614 06/07/21 0710  WBC 18.7* 14.6* 18.0*  RBC 4.23 3.69* 4.00  HGB 13.3 11.6* 12.3  HCT 40.5 34.6* 37.5  MCV 95.7 93.8 93.8  MCH 31.4 31.4 30.8  MCHC 32.8 33.5 32.8  RDW 13.5 13.8 14.1  PLT 141* 139* 135*    Cardiac EnzymesNo results for input(s): TROPONINI in the last 168 hours. No results for input(s): TROPIPOC in the last 168 hours.   BNP Recent Labs  Lab 06/05/21 1010  BNP 1,448.1*     DDimer No results for input(s): DDIMER in  the last 168 hours.   Radiology    DG Chest Port 1 View  Result Date: 06/07/2021 IMPRESSION: No active disease. Electronically Signed   By: Richarda Overlie M.D.   On: 06/07/2021 12:08   Cardiac Studies   2D echo 06/2019: 1. Left ventricular ejection fraction, by visual estimation, is 20 to  25%. The left ventricle has severely decreased function. Normal left  ventricular size. There is no left ventricular hypertrophy.Global  hypokinesis.   2. Left ventricular diastolic Doppler parameters are consistent with  pseudonormalization pattern of LV diastolic filling.   3. Global right ventricle has normal systolic function.The right  ventricular size is normal. No increase in right ventricular wall  thickness.   4. Left atrial size was normal.   5. Mild to moderate mitral valve regurgitation.   6. TR signal is inadequate for assessing pulmonary artery systolic  pressure.  __________   2D echo 12/2020 [facility]): Mildly dilated left ventricle with severe systolic  dysfunction.    Left ventricular ejection fraction estimated at 29% by 3D;  20% by  Simpson's biplane.  Global hypokinesis with paradoxical septal motion due to left bundle  branch block.  Normal left ventricular wall wall thickness.  Grade I diastolic dysfunction (Impaired relaxation) with elevated  LA  pressure.  Mild left atrial dilation.  Normal right ventricular size and systolic function.  No significant valvular abnormalities.  Normal central venous pressure.  Indeterminate PA systolic pressure due to  lack of TR jet.  No prior studies for comparison.  __________   2D echo 06/07/2021: 1. Left ventricular ejection fraction, by estimation, is 20 to 25%. The  left ventricle has severely decreased function. The left ventricle  demonstrates global hypokinesis. The left ventricular internal cavity size  was mildly dilated. Left ventricular  diastolic parameters are consistent with Grade I diastolic dysfunction  (impaired relaxation).   2. Right ventricular systolic function is normal. The right ventricular  size is normal. Tricuspid regurgitation signal is inadequate for assessing  PA pressure.   3. The mitral valve is normal in structure. Mild mitral valve  regurgitation. No evidence of mitral stenosis. Moderate mitral annular  calcification.   4. The aortic valve is normal in structure. Aortic valve regurgitation is  not visualized. Mild to moderate aortic valve sclerosis/calcification is  present, without any evidence of aortic stenosis.   5. The inferior vena cava is normal in size with greater than 50%  respiratory variability, suggesting right atrial pressure of 3 mmHg.   Patient Profile     87 y.o. female with history of HFrEF presumed to be dilated NICM (no prior ischemic evaluation for review), new onset Afib with RVR, HTN, pulmonary nodules, underweight who is being seen today for the evaluation of new onset Afib with RVR, acute on chronic HFrEF and elevated  troponin.  Assessment & Plan    1. Acute on chronic HFrEF: -She has been transitioned from IV Lasix to oral Lasix 20 mg bid as of the evening of 9/26 -At baseline, she does not have much oral intake and there are concerns for dehydration moving forward, with this, recommend resumption of once weekly Lasix 20 mg with an extra 20 mg prn weight gain > 2-3 pounds overnight -Presumed to be dilated NICM per [facility] cardiology note -Not a candidate for ischemic evaluation or advanced therapies such as ICD/CRT-D given advanced age and DNR status -Echo as above with stable, persistent cardiomyopathy -Transition Lopressor to Toprol XL given her cardiomyopathy -Decrease  losartan to 12.5 mg in an effort to minimize hypotension given her advanced age and frail state -Relative hypotension precludes further escalation of GDMT at this time -Hold parameters have been placed for Toprol and losartan    2. New onset Afib with RVR: -Brief episode on the evening of 9/25 lasting ~ 2 minutes, no episodes since -Likely in the setting of her acute illness and volume overload -CHADS2VASc at least 5 (CHF, HTN, age x 2, sex category) -With brief episode occurring during acute illness and increased fall risk, would defer OAC at this time as risks appear to outweigh benefit, family agrees -TSH normal -Magnesium at goal -Potassium normal -Transition Lopressor to Toprol as above -If there is an increase in burden, could stop ARB to allow for more BP room to titrate beta blocker   3. Elevated high sensitivity troponin: -Mild elevation, peaking at 178, not consistent with ACS -No indication for heparin gtt at this time without dynamic elevation -She does have a known cardiomyopathy as outlined above, without prior ischemic evaluation for review -Not a candidate for LHC given advanced age (documented at [facility] as well), family agrees -ASA  4. NSVT: -Asymptomatic  -Continue beta blocker as above -Magnesium at  goal -Potassium normal  -TSH normal -No indication for amiodarone at this time   5.  Fever/cellulitis: -No further fever -Management per internal medicine  For questions or updates, please contact [facility] Please consult [email or web address] for contact info under Cardiology/STEMI.    Signed, Eula Listen, PA-C [facility] Pager: [phone] 06/08/2021, 7:11 AM

## 2021-06-08 NOTE — Discharge Summary (Signed)
Physician Discharge Summary  Patricia Bradford YQI:[id number] DOB: 05-25-1916 DOA: 06/05/2021  PCP: Casilda Carls, MD  Admit date: 06/05/2021 Discharge date: 06/08/2021  Admitted From: home Disposition:  home  Recommendations for Outpatient Follow-up:  Follow up with PCP in 1-2 weeks Please obtain BMP/CBC in one week Please follow up on resolution of right leg cellulitis Follow up on blood pressure and if cardiac medications are tolerated.  Consider stopping losartan if blood pressure is running to low, per cardiology  Home Health: No  Equipment/Devices: None   Discharge Condition: Stable  CODE STATUS: DNR  Diet recommendation: Low sodium / heart healthy  Discharge Diagnoses: Principal Problem:   Acute on chronic systolic CHF (congestive heart failure) (HCC) Active Problems:   HTN (hypertension)   Leukocytosis   Elevated troponin   Protein-calorie malnutrition, severe (HCC)    Summary of HPI and Hospital Course:  87 year old female with past medical history of hypertension, systolic CHF with EF of 20 to 25%.  Hard of hearing who presented to the ED on 06/05/2021 with complaints of shortness of breath progressively worsening.  On arrival to the ED patient was in respiratory distress and required BiPAP initially.  She was later weaned off BiPAP and maintaining sats on 2 L/min nasal cannula O2.   Found to have acutely decompensated CHF and admitted to [facility] service. Started on IV diuresis.    Acute respiratory failure with hypoxia due to Acute on chronic systolic CHF -prior echo with EF of 20-25%. Cardiology consulted. Responded well to IV Lasix. At discharge - Lasix recommended only 20 mg once WEEKLY with additional as needed doses for weight gain or swelling.  Hypoxia resolved. Monitor renal function and electrolytes Daily weights at home Low-sodium diet with fluid restriction Losartan decreased to 12.5 mg Lopressor transitioned to Toprol XL 12.5 mg daily  Patient  clinically improved, stable for discharge home.  Patient and daughter agreeable.   Severe Sepsis due to Right lower extremity cellulitis -present on admission.  Due to recent cat bite. Pt met severe sepsis on admission with leukocytosis, tachycardia in setting of cellulitis. Sepsis physiology improved and lactic acidosis resolved. Lactic acidosis consistent with organ dysfunction and severe sepsis.  Initially started on Keflex.   Changed to Rocephin after patient became febrile overnight 9/25-26. No proximal tracking of erythema seen. --Treated with Rocephin and clinically improved --Discharge on 3 more days Omnicef  --Follow blood cultures - negative to date --Monitor RLE erythema for expansion or proximal tracking   Paroxysmal A-fib - poor candidate for anticoaulation given age and bleed risk.  Noted runs of A-fib were brief.  On low dose beta blocker.  Follow up with cardiology.   Elevated troponin -mild, most likely demand ischemia.  Patient is not having any chest pain and no acute ischemic changes on EKG.  Continue on aspirin.  Hypertension -continue on home losartan, dose reduced.  Also on low dose metoprolol.  Leukocytosis -present with WBC 18.7k.  Due to RLE cellulitis.   Chest x-ray was negative.   Severe protein calorie malnutrition -  Patient BMI: Body mass index is 16.08 kg/m.  Nutritional supplement drinks ordered.   Discharge Instructions   Discharge Instructions     (HEART FAILURE PATIENTS) Call MD:  Anytime you have any of the following symptoms: 1) 3 pound weight gain in 24 hours or 5 pounds in 1 week 2) shortness of breath, with or without a dry hacking cough 3) swelling in the hands, feet or stomach 4) if you have  to sleep on extra pillows at night in order to breathe.   Complete by: As directed    Call MD for:  extreme fatigue   Complete by: As directed    Call MD for:  persistant dizziness or light-headedness   Complete by: As directed    Call MD for:   persistant nausea and vomiting   Complete by: As directed    Call MD for:  severe uncontrolled pain   Complete by: As directed    Call MD for:  temperature >100.4   Complete by: As directed    Diet - low sodium heart healthy   Complete by: As directed    Discharge instructions   Complete by: As directed    For the skin infection on your leg, I have sent 3 more days of antibiotics to your pharmacy. If you have recurrence of pain, warmth or redness in the leg after antibiotics are complete please call your primary care provider.  Lasix --- cardiology recommended taking only 20 mg of Lasix once weekly. Please continue to weigh yourself each day and if you are gaining weight or have swelling, you may take extra Lasix.  If you have questions about this please contact cardiology's office or your primary care.  Blood pressure --- please check your blood pressure at home prior to taking losartan, metoprolol or Lasix. If the top number is less than 120 or the bottom number is less than 60, recommend holding the medication at that time to prevent dropping your blood pressure low.  Low blood pressures puts you at risk of dizziness lightheadedness, passing out or falling.   Increase activity slowly   Complete by: As directed       Allergies as of 06/08/2021   No Known Allergies      Medication List     TAKE these medications    aspirin EC 81 MG tablet Take 81 mg by mouth daily.   cefdinir 300 MG capsule Commonly known as: OMNICEF Take 1 capsule (300 mg total) by mouth 2 (two) times daily for 3 days.   Cranberry 400 MG Caps Take 400 mg by mouth daily.   feeding supplement Liqd Take 237 mLs by mouth 2 (two) times daily between meals.   furosemide 20 MG tablet Commonly known as: LASIX Take 1 tablet (20 mg total) by mouth once a week. If weight increasing or swelling, take additional 20 mg daily as needed. What changed:  when to take this additional instructions Another medication  with the same name was removed. Continue taking this medication, and follow the directions you see here.   losartan 25 MG tablet Commonly known as: COZAAR Take 0.5 tablets (12.5 mg total) by mouth 2 (two) times daily. What changed:  how much to take when to take this   metoprolol succinate 25 MG 24 hr tablet Commonly known as: TOPROL-XL Take 0.5 tablets (12.5 mg total) by mouth daily.   Multi-Vitamin tablet Take 1 tablet by mouth daily.        No Known Allergies   If you experience worsening of your admission symptoms, develop shortness of breath, life threatening emergency, suicidal or homicidal thoughts you must seek medical attention immediately by calling 911 or calling your MD immediately  if symptoms less severe.    Please note   You were cared for by a hospitalist during your hospital stay. If you have any questions about your discharge medications or the care you received while you were in the  hospital after you are discharged, you can call the unit and asked to speak with the hospitalist on call if the hospitalist that took care of you is not available. Once you are discharged, your primary care physician will handle any further medical issues. Please note that NO REFILLS for any discharge medications will be authorized once you are discharged, as it is imperative that you return to your primary care physician (or establish a relationship with a primary care physician if you do not have one) for your aftercare needs so that they can reassess your need for medications and monitor your lab values.   Consultations: Cardiology    Procedures/Studies: [facility] Chest Port 1 View  Result Date: 06/07/2021 CLINICAL DATA:  Fever. EXAM: PORTABLE CHEST 1 VIEW COMPARISON:  06/05/2021 FINDINGS: Coarse lung markings are unchanged. No focal airspace disease. No overt pulmonary edema. Heart and mediastinum are within normal limits. Atherosclerotic calcifications at the aortic arch. Negative  for a pneumothorax. IMPRESSION: No active disease. Electronically Signed   By: Markus Daft M.D.   On: 06/07/2021 12:08   DG Chest Portable 1 View  Result Date: 06/05/2021 CLINICAL DATA:  Difficulty breathing starting today. Pt on respirator and unable to answer question, could not obtain history. EXAM: PORTABLE CHEST 1 VIEW COMPARISON:  06/27/2019 FINDINGS: Cardiac silhouette is normal in size. No mediastinal or hilar masses. Lungs are hyperexpanded. There is chronic interstitial thickening, most evident at the bases, stable. No evidence of pneumonia or pulmonary edema. No convincing pleural effusion and no pneumothorax. Skeletal structures are grossly intact. IMPRESSION: 1. No acute cardiopulmonary disease. Electronically Signed   By: Lajean Manes M.D.   On: 06/05/2021 10:28   ECHOCARDIOGRAM COMPLETE  Result Date: 06/07/2021    ECHOCARDIOGRAM REPORT   Patient Name:   Patricia Bradford Date of Exam: 06/07/2021 Medical Rec #:  [id number]      Height:       66.0 in Accession #:    [id number]     Weight:       97.2 lb Date of Birth:  [birth year]       BSA:          1.472 m Patient Age:    89 years      BP:           125/63 mmHg Patient Gender: F              HR:           101 bpm. Exam Location:  [facility] Procedure: 2D Echo, Cardiac Doppler and Color Doppler Indications:     CHF-acute systolic I01.65  History:         Patient has prior history of Echocardiogram examinations, most                  recent 06/28/2019. CHF; Risk Factors:Hypertension.  Sonographer:     Sherrie Sport Referring Phys:  [place] Diagnosing Phys: Kathlyn Sacramento MD IMPRESSIONS  1. Left ventricular ejection fraction, by estimation, is 20 to 25%. The left ventricle has severely decreased function. The left ventricle demonstrates global hypokinesis. The left ventricular internal cavity size was mildly dilated. Left ventricular diastolic parameters are consistent with Grade I diastolic dysfunction (impaired relaxation).  2. Right ventricular  systolic function is normal. The right ventricular size is normal. Tricuspid regurgitation signal is inadequate for assessing PA pressure.  3. The mitral valve is normal in structure. Mild mitral valve regurgitation. No evidence of mitral stenosis. Moderate mitral annular  calcification.  4. The aortic valve is normal in structure. Aortic valve regurgitation is not visualized. Mild to moderate aortic valve sclerosis/calcification is present, without any evidence of aortic stenosis.  5. The inferior vena cava is normal in size with greater than 50% respiratory variability, suggesting right atrial pressure of 3 mmHg. FINDINGS  Left Ventricle: Left ventricular ejection fraction, by estimation, is 20 to 25%. The left ventricle has severely decreased function. The left ventricle demonstrates global hypokinesis. The left ventricular internal cavity size was mildly dilated. There is no left ventricular hypertrophy. Left ventricular diastolic parameters are consistent with Grade I diastolic dysfunction (impaired relaxation). Right Ventricle: The right ventricular size is normal. No increase in right ventricular wall thickness. Right ventricular systolic function is normal. Tricuspid regurgitation signal is inadequate for assessing PA pressure. The tricuspid regurgitant velocity is 2.52 m/s, and with an assumed right atrial pressure of 3 mmHg, the estimated right ventricular systolic pressure is 81.1 mmHg. (TR signal likely not accurate) Left Atrium: Left atrial size was normal in size. Right Atrium: Right atrial size was normal in size. Pericardium: There is no evidence of pericardial effusion. Mitral Valve: The mitral valve is normal in structure. Moderate mitral annular calcification. Mild mitral valve regurgitation. No evidence of mitral valve stenosis. Tricuspid Valve: The tricuspid valve is normal in structure. Tricuspid valve regurgitation is trivial. No evidence of tricuspid stenosis. Aortic Valve: The aortic valve is  normal in structure. Aortic valve regurgitation is not visualized. Mild to moderate aortic valve sclerosis/calcification is present, without any evidence of aortic stenosis. Aortic valve mean gradient measures 3.7 mmHg. Aortic valve peak gradient measures 6.4 mmHg. Aortic valve area, by VTI measures 1.53 cm. Pulmonic Valve: The pulmonic valve was normal in structure. Pulmonic valve regurgitation is trivial. No evidence of pulmonic stenosis. Aorta: The aortic root is normal in size and structure. Venous: The inferior vena cava is normal in size with greater than 50% respiratory variability, suggesting right atrial pressure of 3 mmHg. IAS/Shunts: No atrial level shunt detected by color flow Doppler.  LEFT VENTRICLE PLAX 2D LVIDd:         5.40 cm      Diastology LVIDs:         4.80 cm      LV e' medial:    2.28 cm/s LV PW:         1.30 cm      LV E/e' medial:  19.9 LV IVS:        0.65 cm      LV e' lateral:   2.72 cm/s LVOT diam:     2.00 cm      LV E/e' lateral: 16.7 LV SV:         36 LV SV Index:   24 LVOT Area:     3.14 cm  LV Volumes (MOD) LV vol d, MOD A2C: 105.0 ml LV vol d, MOD A4C: 102.0 ml LV vol s, MOD A2C: 72.2 ml LV vol s, MOD A4C: 75.4 ml LV SV MOD A2C:     32.8 ml LV SV MOD A4C:     102.0 ml LV SV MOD BP:      34.2 ml RIGHT VENTRICLE RV Basal diam:  2.60 cm RV S prime:     13.70 cm/s TAPSE (M-mode): 3.6 cm LEFT ATRIUM             Index       RIGHT ATRIUM  Index LA diam:        3.40 cm 2.31 cm/m  RA Area:     11.40 cm LA Vol (A2C):   57.4 ml 39.00 ml/m RA Volume:   23.00 ml  15.63 ml/m LA Vol (A4C):   38.5 ml 26.16 ml/m LA Biplane Vol: 48.9 ml 33.22 ml/m  AORTIC VALVE                   PULMONIC VALVE AV Area (Vmax):    1.54 cm    PV Vmax:        0.74 m/s AV Area (Vmean):   1.57 cm    PV Peak grad:   2.2 mmHg AV Area (VTI):     1.53 cm    RVOT Peak grad: 2 mmHg AV Vmax:           126.33 cm/s AV Vmean:          91.533 cm/s AV VTI:            0.234 m AV Peak Grad:      6.4 mmHg AV Mean  Grad:      3.7 mmHg LVOT Vmax:         62.00 cm/s LVOT Vmean:        45.600 cm/s LVOT VTI:          0.114 m LVOT/AV VTI ratio: 0.49  AORTA Ao Root diam: 3.00 cm MITRAL VALVE               TRICUSPID VALVE MV Area (PHT): 4.60 cm    TR Peak grad:   25.4 mmHg MV Decel Time: 165 msec    TR Vmax:        252.00 cm/s MV E velocity: 45.40 cm/s MV A velocity: 86.10 cm/s  SHUNTS MV E/A ratio:  0.53        Systemic VTI:  0.11 m                            Systemic Diam: 2.00 cm Kathlyn Sacramento MD Electronically signed by Kathlyn Sacramento MD Signature Date/Time: 06/07/2021/9:23:31 AM    Final        Subjective: Pt reports feeling well.  No SOB, CP, fever/chills or other complaints.  Says shes' eager to return home.  No acute events reported overnight.     Discharge Exam: Vitals:   06/08/21 0431 06/08/21 0747  BP: (!) 92/41 (!) 100/55  Pulse: 72 77  Resp: 16 17  Temp: 98.7 F (37.1 C) 98 F (36.7 C)  SpO2: 93% 92%   Vitals:   06/07/21 1936 06/08/21 0016 06/08/21 0431 06/08/21 0747  BP: (!) 96/43 (!) 110/44 (!) 92/41 (!) 100/55  Pulse: 62 76 72 77  Resp: 16 16 16 17   Temp: 97.8 F (36.6 C) 98.5 F (36.9 C) 98.7 F (37.1 C) 98 F (36.7 C)  TempSrc: Oral Oral Oral   SpO2: 93% 91% 93% 92%  Weight:   45.2 kg   Height:        General: Pt is alert, awake, not in acute distress, frail Cardiovascular: RRR, S1/S2 +, no rubs, no gallops Respiratory: CTA bilaterally, no wheezing, no rhonchi Abdominal: Soft, NT, ND, bowel sounds + Extremities: no edema, no cyanosis    The results of significant diagnostics from this hospitalization (including imaging, microbiology, ancillary and laboratory) are listed below for reference.     Microbiology: Recent Results (from the past 240  hour(s))  Resp Panel by RT-PCR (Flu A&B, Covid) Nasopharyngeal Swab     Status: None   Collection Time: 06/05/21 10:11 AM   Specimen: Nasopharyngeal Swab; Nasopharyngeal(NP) swabs in vial transport medium  Result Value Ref  Range Status   SARS Coronavirus 2 by RT PCR NEGATIVE NEGATIVE Final    Comment: (NOTE) SARS-CoV-2 target nucleic acids are NOT DETECTED.  The SARS-CoV-2 RNA is generally detectable in upper respiratory specimens during the acute phase of infection. The lowest concentration of SARS-CoV-2 viral copies this assay can detect is 138 copies/mL. A negative result does not preclude SARS-Cov-2 infection and should not be used as the sole basis for treatment or other patient management decisions. A negative result may occur with  improper specimen collection/handling, submission of specimen other than nasopharyngeal swab, presence of viral mutation(s) within the areas targeted by this assay, and inadequate number of viral copies(<138 copies/mL). A negative result must be combined with clinical observations, patient history, and epidemiological information. The expected result is Negative.  Fact Sheet for Patients:  EntrepreneurPulse.com.au  Fact Sheet for Healthcare Providers:  IncredibleEmployment.be  This test is no t yet approved or cleared by the [place] FDA and  has been authorized for detection and/or diagnosis of SARS-CoV-2 by FDA under an Emergency Use Authorization (EUA). This EUA will remain  in effect (meaning this test can be used) for the duration of the COVID-19 declaration under Section 564(b)(1) of the Act, 21 U.S.C.section 360bbb-3(b)(1), unless the authorization is terminated  or revoked sooner.       Influenza A by PCR NEGATIVE NEGATIVE Final   Influenza B by PCR NEGATIVE NEGATIVE Final    Comment: (NOTE) The Xpert Xpress SARS-CoV-2/FLU/RSV plus assay is intended as an aid in the diagnosis of influenza from Nasopharyngeal swab specimens and should not be used as a sole basis for treatment. Nasal washings and aspirates are unacceptable for Xpert Xpress SARS-CoV-2/FLU/RSV testing.  Fact Sheet for  Patients: EntrepreneurPulse.com.au  Fact Sheet for Healthcare Providers: IncredibleEmployment.be  This test is not yet approved or cleared by the [place] FDA and has been authorized for detection and/or diagnosis of SARS-CoV-2 by FDA under an Emergency Use Authorization (EUA). This EUA will remain in effect (meaning this test can be used) for the duration of the COVID-19 declaration under Section 564(b)(1) of the Act, 21 U.S.C. section 360bbb-3(b)(1), unless the authorization is terminated or revoked.  Performed at [facility], [address] [place], [place] [address]   CULTURE, BLOOD (ROUTINE X 2) w Reflex to ID Panel     Status: None (Preliminary result)   Collection Time: 06/07/21  8:57 AM   Specimen: BLOOD  Result Value Ref Range Status   Specimen Description BLOOD BLOOD LEFT HAND  Final   Special Requests   Final    BOTTLES DRAWN AEROBIC AND ANAEROBIC Blood Culture adequate volume   Culture   Final    NO GROWTH < 24 HOURS Performed at [facility], [address] [place], [place] [address]    Report Status PENDING  Incomplete  CULTURE, BLOOD (ROUTINE X 2) w Reflex to ID Panel     Status: None (Preliminary result)   Collection Time: 06/07/21  9:13 AM   Specimen: BLOOD  Result Value Ref Range Status   Specimen Description BLOOD BLOOD RIGHT HAND  Final   Special Requests   Final    BOTTLES DRAWN AEROBIC AND ANAEROBIC Blood Culture adequate volume   Culture   Final  NO GROWTH < 24 HOURS Performed at [facility], [address] [place], [place] [address]    Report Status PENDING  Incomplete  MRSA Next Gen by PCR, Nasal     Status: None   Collection Time: 06/07/21 12:49 PM   Specimen: Nasal Mucosa; Nasal Swab  Result Value Ref Range Status   MRSA by PCR Next Gen NOT DETECTED NOT DETECTED Final    Comment: (NOTE) The GeneXpert MRSA Assay (FDA approved for NASAL specimens only), is one  component of a comprehensive MRSA colonization surveillance program. It is not intended to diagnose MRSA infection nor to guide or monitor treatment for MRSA infections. Test performance is not FDA approved in patients less than 42 years old. Performed at [facility], [address] [place], [place] [address]      Labs: BNP (last 3 results) Recent Labs    06/05/21 1010  BNP 2,542.7*   Basic Metabolic Panel: Recent Labs  Lab 06/05/21 1010 06/06/21 0614 06/07/21 0710 06/08/21 0849  NA 139 137 138 137  K 3.9 4.2 3.8 4.2  CL 101 101 99 97*  CO2 28 28 29 29   GLUCOSE 168* 111* 122* 92  BUN 20 22 34* 40*  CREATININE 0.61 0.77 0.98 0.91  CALCIUM 8.7* 8.5* 8.4* 8.0*  MG  --  1.8 2.5*  --    Liver Function Tests: Recent Labs  Lab 06/05/21 1010  AST 24  ALT 13  ALKPHOS 59  BILITOT 0.8  PROT 6.6  ALBUMIN 3.9   No results for input(s): LIPASE, AMYLASE in the last 168 hours. No results for input(s): AMMONIA in the last 168 hours. CBC: Recent Labs  Lab 06/05/21 1010 06/06/21 0614 06/07/21 0710 06/08/21 0849  WBC 18.7* 14.6* 18.0* 9.7  NEUTROABS 12.5*  --   --   --   HGB 13.3 11.6* 12.3 11.7*  HCT 40.5 34.6* 37.5 34.6*  MCV 95.7 93.8 93.8 94.8  PLT 141* 139* 135* 136*   Cardiac Enzymes: No results for input(s): CKTOTAL, CKMB, CKMBINDEX, TROPONINI in the last 168 hours. BNP: Invalid input(s): POCBNP CBG: No results for input(s): GLUCAP in the last 168 hours. D-Dimer No results for input(s): DDIMER in the last 72 hours. Hgb A1c Recent Labs    06/05/21 1220  HGBA1C 5.5   Lipid Profile Recent Labs    06/06/21 0614  CHOL 153  HDL 70  LDLCALC 76  TRIG 33  CHOLHDL 2.2   Thyroid function studies Recent Labs    06/07/21 0710  TSH 1.777   Anemia work up No results for input(s): VITAMINB12, FOLATE, FERRITIN, TIBC, IRON, RETICCTPCT in the last 72 hours. Urinalysis    Component Value Date/Time   COLORURINE YELLOW (A) 06/05/2021 1914    APPEARANCEUR CLEAR (A) 06/05/2021 1914   LABSPEC 1.011 06/05/2021 1914   PHURINE 5.0 06/05/2021 1914   GLUCOSEU NEGATIVE 06/05/2021 1914   HGBUR SMALL (A) 06/05/2021 1914   BILIRUBINUR NEGATIVE 06/05/2021 [place] NEGATIVE 06/05/2021 1914   PROTEINUR NEGATIVE 06/05/2021 1914   NITRITE NEGATIVE 06/05/2021 1914   LEUKOCYTESUR NEGATIVE 06/05/2021 1914   Sepsis Labs Invalid input(s): PROCALCITONIN,  WBC,  LACTICIDVEN Microbiology Recent Results (from the past 240 hour(s))  Resp Panel by RT-PCR (Flu A&B, Covid) Nasopharyngeal Swab     Status: None   Collection Time: 06/05/21 10:11 AM   Specimen: Nasopharyngeal Swab; Nasopharyngeal(NP) swabs in vial transport medium  Result Value Ref Range Status   SARS Coronavirus 2 by RT PCR  NEGATIVE NEGATIVE Final    Comment: (NOTE) SARS-CoV-2 target nucleic acids are NOT DETECTED.  The SARS-CoV-2 RNA is generally detectable in upper respiratory specimens during the acute phase of infection. The lowest concentration of SARS-CoV-2 viral copies this assay can detect is 138 copies/mL. A negative result does not preclude SARS-Cov-2 infection and should not be used as the sole basis for treatment or other patient management decisions. A negative result may occur with  improper specimen collection/handling, submission of specimen other than nasopharyngeal swab, presence of viral mutation(s) within the areas targeted by this assay, and inadequate number of viral copies(<138 copies/mL). A negative result must be combined with clinical observations, patient history, and epidemiological information. The expected result is Negative.  Fact Sheet for Patients:  EntrepreneurPulse.com.au  Fact Sheet for Healthcare Providers:  IncredibleEmployment.be  This test is no t yet approved or cleared by the [place] FDA and  has been authorized for detection and/or diagnosis of SARS-CoV-2 by FDA under an Emergency Use  Authorization (EUA). This EUA will remain  in effect (meaning this test can be used) for the duration of the COVID-19 declaration under Section 564(b)(1) of the Act, 21 U.S.C.section 360bbb-3(b)(1), unless the authorization is terminated  or revoked sooner.       Influenza A by PCR NEGATIVE NEGATIVE Final   Influenza B by PCR NEGATIVE NEGATIVE Final    Comment: (NOTE) The Xpert Xpress SARS-CoV-2/FLU/RSV plus assay is intended as an aid in the diagnosis of influenza from Nasopharyngeal swab specimens and should not be used as a sole basis for treatment. Nasal washings and aspirates are unacceptable for Xpert Xpress SARS-CoV-2/FLU/RSV testing.  Fact Sheet for Patients: EntrepreneurPulse.com.au  Fact Sheet for Healthcare Providers: IncredibleEmployment.be  This test is not yet approved or cleared by the [place] FDA and has been authorized for detection and/or diagnosis of SARS-CoV-2 by FDA under an Emergency Use Authorization (EUA). This EUA will remain in effect (meaning this test can be used) for the duration of the COVID-19 declaration under Section 564(b)(1) of the Act, 21 U.S.C. section 360bbb-3(b)(1), unless the authorization is terminated or revoked.  Performed at [facility], [address] [place], [place] [address]   CULTURE, BLOOD (ROUTINE X 2) w Reflex to ID Panel     Status: None (Preliminary result)   Collection Time: 06/07/21  8:57 AM   Specimen: BLOOD  Result Value Ref Range Status   Specimen Description BLOOD BLOOD LEFT HAND  Final   Special Requests   Final    BOTTLES DRAWN AEROBIC AND ANAEROBIC Blood Culture adequate volume   Culture   Final    NO GROWTH < 24 HOURS Performed at [facility], [address], [place], [place] [address]    Report Status PENDING  Incomplete  CULTURE, BLOOD (ROUTINE X 2) w Reflex to ID Panel     Status: None (Preliminary result)   Collection Time: 06/07/21  9:13  AM   Specimen: BLOOD  Result Value Ref Range Status   Specimen Description BLOOD BLOOD RIGHT HAND  Final   Special Requests   Final    BOTTLES DRAWN AEROBIC AND ANAEROBIC Blood Culture adequate volume   Culture   Final    NO GROWTH < 24 HOURS Performed at [facility], [address], [place], [place] [address]    Report Status PENDING  Incomplete  MRSA Next Gen by PCR, Nasal     Status: None   Collection Time: 06/07/21 12:49 PM   Specimen:  Nasal Mucosa; Nasal Swab  Result Value Ref Range Status   MRSA by PCR Next Gen NOT DETECTED NOT DETECTED Final    Comment: (NOTE) The GeneXpert MRSA Assay (FDA approved for NASAL specimens only), is one component of a comprehensive MRSA colonization surveillance program. It is not intended to diagnose MRSA infection nor to guide or monitor treatment for MRSA infections. Test performance is not FDA approved in patients less than 14 years old. Performed at [facility], [address] [place], [place] [address]      Time coordinating discharge: Over 30 minutes  SIGNED:   Ezekiel Slocumb, DO [facility] 06/08/2021, 10:37 AM   If 7PM-7AM, please contact night-coverage [email or web address]

## 2021-06-08 NOTE — Care Management Important Message (Signed)
Important Message  Patient Details  Name: Patricia Bradford MRN: [id number] Date of Birth: 21-Sep-1915   Medicare Important Message Given:  N/A - LOS <3 / Initial given by admissions     [name] 06/08/2021, 1:23 PM

## 2021-06-12 LAB — CULTURE, BLOOD (ROUTINE X 2)
Culture: NO GROWTH
Culture: NO GROWTH
Special Requests: ADEQUATE
Special Requests: ADEQUATE

## 2021-06-14 ENCOUNTER — Encounter: Payer: Self-pay | Admitting: Family

## 2021-06-14 NOTE — Progress Notes (Deleted)
   Patient ID: Patricia Bradford, female    DOB: 26-Jul-1916, 87 y.o.   MRN: [id number]  HPI  [name] is a 87 y/o female with a history of  Echo report from 06/07/21 reviewed and showed an EF of 20-25% along with mild MR.   Admitted 06/05/21 due to shortness of breath. Initially placed on bipap with transition to nasal cannula oxygen. Cardiology consult obtained. Given IV lasix with transition to oral diuretics. IV hydralazine given PRN for HTN. Developed SVT and treated with IV diltiazem and IV digoxin. Discharged after 3 days.   She presents today for her initial visit with a chief complaint of   Review of Systems    Physical Exam  Assessment & Plan:  1: Chronic heart failure with reduced ejection fraction- - NYHA class - BNP 06/05/21 was 1448.1  2: HTN- - BP - sees PCP Patricia Bradford) - BMP 06/08/21 reviewed and showed sodium 137, potassium 4.2, creatinine 0.91 and GFR 55  3: Atrial fibrillation- - sees cardiology Patricia Bradford) 06/24/21

## 2021-06-15 ENCOUNTER — Ambulatory Visit: Payer: Medicare Other | Admitting: Family

## 2021-06-21 ENCOUNTER — Ambulatory Visit (INDEPENDENT_AMBULATORY_CARE_PROVIDER_SITE_OTHER): Payer: Medicare Other | Admitting: Medical

## 2021-06-21 ENCOUNTER — Encounter: Payer: Self-pay | Admitting: Medical

## 2021-06-21 ENCOUNTER — Telehealth: Payer: Self-pay | Admitting: Medical

## 2021-06-21 ENCOUNTER — Other Ambulatory Visit: Payer: Self-pay

## 2021-06-21 VITALS — BP 140/70 | HR 81 | Ht <= 58 in | Wt 92.5 lb

## 2021-06-21 DIAGNOSIS — I10 Essential (primary) hypertension: Secondary | ICD-10-CM

## 2021-06-21 DIAGNOSIS — I480 Paroxysmal atrial fibrillation: Secondary | ICD-10-CM

## 2021-06-21 DIAGNOSIS — L03119 Cellulitis of unspecified part of limb: Secondary | ICD-10-CM | POA: Diagnosis not present

## 2021-06-21 DIAGNOSIS — I5023 Acute on chronic systolic (congestive) heart failure: Secondary | ICD-10-CM | POA: Diagnosis not present

## 2021-06-21 DIAGNOSIS — L02419 Cutaneous abscess of limb, unspecified: Secondary | ICD-10-CM

## 2021-06-21 MED ORDER — DOXYCYCLINE HYCLATE 100 MG PO TABS: ORAL_TABLET | ORAL | 0 refills | Status: DC

## 2021-06-21 NOTE — Telephone Encounter (Signed)
Pt did not want a call back. Pt is scheduled to see Cadence Furth, NP 06/22/21 at 8:30 AM.

## 2021-06-21 NOTE — Telephone Encounter (Signed)
Pt c/o swelling: STAT is pt has developed SOB within 24 hours  If swelling, where is the swelling located? R Foot to ankle   How much weight have you gained and in what time span? none  Have you gained 3 pounds in a day or 5 pounds in a week? No   Do you have a log of your daily weights (if so, list)? Not given per daughter lost 1 pound   Are you currently taking a fluid pill? yes  Are you currently SOB? No   Have you traveled recently? No recent admission   Patient having swelling and pain to touch in R foot and ankle .  Per daughter no hotness or redness at site and no sob    Wants asap appt.  Moved up to 10-11 at 0830 am cadance

## 2021-06-21 NOTE — Progress Notes (Signed)
Cardiology Office Note:    Date:  06/21/2021   ID:  LIVI MCGANN, DOB Aug 10, 1916, MRN [id number]  PCP:  Sherrie Mustache, MD  [facility] Cardiologist:  None  [facility] Electrophysiologist:  None   Referring MD: Sherrie Mustache, MD   Chief Complaint: Lower leg swelling  History of Present Illness:    Patricia Bradford is a 87 y.o. female with a hx of HFrEF presumed to be dilated NICM (no prior ischemic evaluation for review), new onset Afib with RVR, HTN, pulmonary nodules, underweight who is being seen today for the evaluation of new onset Afib with RVR, acute on chronic HFrEF and elevated troponin.  Ms. Nadeau was diagnosed with HFrEF in 06/2019 during hospital admission with echo at that time showing an EF of 20-25%, global hypokinesis, diastolic dysfunction, normal RV ssytolic function and ventricular cavity size, normal size left atrium, and mild to moderate mitral regurgitation.  She was conservatively managed by the primary service at that time.    She was admitted to the hospital in [facility] in 12/2020 with acute on chronic HFrEF in the setting of hypertensive crisis. Echo at that time showed an EF of ~ 20-30%, global hypokinesis with septal wall motion abnormality due to LBBB, Gr1DD, mild left atrial dilatation, normal RVSF and ventricular cavity size, and no significant valvular abnormalities. SYmptoms improved with diuresis. Per cardiology during that admission, she was not candidate for invasive procedure nor was she a candidate for escalation of GDMT given advanced age and marginal BP. Notes do not indicate Afib was present at that time. Discharge weight of 90 pounds.   Admitted 9/24-9/27 with new onset Afib, acute CHF, leukocytosis, elevated troponin. Seh was diuresed and transitioned to lasix. Not a candidate for ischeimc evaluation or ICD given advanced age and DNR status. CHADSVASC at least 5 however she was not started on [facility] given fall risk.  Today, the patient reports  swelling in the right foot. It started a week ago. She takes lasix 20mg  once a week. Says it has been intermittent. Has some heat, no redness. Has has cellulitis in the past. No chest pain or SOB. Weight has been stable.   Past Medical History:  Diagnosis Date   Chronic systolic congestive heart failure (HCC)    HTN (hypertension)     Past Surgical History:  Procedure Laterality Date   kidney stone N/A     Current Medications: Current Meds  Medication Sig   aspirin EC 81 MG tablet Take 81 mg by mouth daily.   Cranberry 400 MG CAPS Take 400 mg by mouth daily.   doxycycline (VIBRA-TABS) 100 MG tablet Take 1 tablet (100 mg) by mouth twice daily x 7 days   furosemide (LASIX) 20 MG tablet Take 1 tablet (20 mg total) by mouth once a week. If weight increasing or swelling, take additional 20 mg daily as needed.   losartan (COZAAR) 25 MG tablet Take 0.5 tablets (12.5 mg total) by mouth 2 (two) times daily.   Multiple Vitamin (MULTI-VITAMIN) tablet Take 1 tablet by mouth daily.     Allergies:   Patient has no known allergies.   Social History   Socioeconomic History   Marital status: Widowed    Spouse name: Not on file   Number of children: Not on file   Years of education: Not on file   Highest education level: Not on file  Occupational History   Not on file  Tobacco Use   Smoking status: Never  Smokeless tobacco: Never  Vaping Use   Vaping Use: Never used  Substance and Sexual Activity   Alcohol use: Never   Drug use: Never   Sexual activity: Not on file  Other Topics Concern   Not on file  Social History Narrative   Not on file   Social Determinants of Health   Financial Resource Strain: Not on file  Food Insecurity: Not on file  Transportation Needs: Not on file  Physical Activity: Not on file  Stress: Not on file  Social Connections: Not on file     Family History: The patient's family history includes Colon cancer in her sister.  ROS:   Please see the  history of present illness.     All other systems reviewed and are negative.  EKGs/Labs/Other Studies Reviewed:    The following studies were reviewed today:  2D echo 06/2019: 1. Left ventricular ejection fraction, by visual estimation, is 20 to  25%. The left ventricle has severely decreased function. Normal left  ventricular size. There is no left ventricular hypertrophy.Global  hypokinesis.   2. Left ventricular diastolic Doppler parameters are consistent with  pseudonormalization pattern of LV diastolic filling.   3. Global right ventricle has normal systolic function.The right  ventricular size is normal. No increase in right ventricular wall  thickness.   4. Left atrial size was normal.   5. Mild to moderate mitral valve regurgitation.   6. TR signal is inadequate for assessing pulmonary artery systolic  pressure.  __________   2D echo 12/2020 [facility]): Mildly dilated left ventricle with severe systolic dysfunction.    Left ventricular ejection fraction estimated at 29% by 3D;  20% by  Simpson's biplane.  Global hypokinesis with paradoxical septal motion due to left bundle  branch block.  Normal left ventricular wall wall thickness.  Grade I diastolic dysfunction (Impaired relaxation) with elevated  LA  pressure.  Mild left atrial dilation.  Normal right ventricular size and systolic function.  No significant valvular abnormalities.  Normal central venous pressure.  Indeterminate PA systolic pressure due to  lack of TR jet.  No prior studies for comparison.  __________   2D echo 06/07/2021: 1. Left ventricular ejection fraction, by estimation, is 20 to 25%. The  left ventricle has severely decreased function. The left ventricle  demonstrates global hypokinesis. The left ventricular internal cavity size  was mildly dilated. Left ventricular  diastolic parameters are consistent with Grade I diastolic dysfunction  (impaired relaxation).   2. Right ventricular  systolic function is normal. The right ventricular  size is normal. Tricuspid regurgitation signal is inadequate for assessing  PA pressure.   3. The mitral valve is normal in structure. Mild mitral valve  regurgitation. No evidence of mitral stenosis. Moderate mitral annular  calcification.   4. The aortic valve is normal in structure. Aortic valve regurgitation is  not visualized. Mild to moderate aortic valve sclerosis/calcification is  present, without any evidence of aortic stenosis.   5. The inferior vena cava is normal in size with greater than 50%  respiratory variability, suggesting right atrial pressure of 3 mmHg.  EKG:  EKG is  ordered today.  The ekg ordered today demonstrates NSR, 81bpm, LAD, IVCD, nonspecific T wave changes  Recent Labs: 06/05/2021: ALT 13; B Natriuretic Peptide 1,448.1 06/07/2021: Magnesium 2.5; TSH 1.777 06/08/2021: BUN 40; Creatinine, Ser 0.91; Hemoglobin 11.7; Platelets 136; Potassium 4.2; Sodium 137  Recent Lipid Panel    Component Value Date/Time   CHOL 153 06/06/2021  3546   TRIG 33 06/06/2021 0614   HDL 70 06/06/2021 0614   CHOLHDL 2.2 06/06/2021 0614   VLDL 7 06/06/2021 0614   LDLCALC 76 06/06/2021 0614    Physical Exam:    VS:  BP 140/70 (BP Location: Left Arm, Patient Position: Sitting, Cuff Size: Normal)   Pulse 81   Ht 4\' 10"  (1.473 m)   Wt 92 lb 8 oz (42 kg)   SpO2 97%   BMI 19.33 kg/m     Wt Readings from Last 3 Encounters:  06/21/21 92 lb 8 oz (42 kg)  06/08/21 99 lb 10.4 oz (45.2 kg)  06/30/19 98 lb (44.5 kg)     GEN:  Well nourished, well developed in no acute distress HEENT: Normal NECK: No JVD; No carotid bruits LYMPHATICS: No lymphadenopathy CARDIAC: RRR, no murmurs, rubs, gallops RESPIRATORY:  Clear to auscultation without rales, wheezing or rhonchi  ABDOMEN: Soft, non-tender, non-distended MUSCULOSKELETAL:  Right sided lower leg edema; No deformity  SKIN: Warm and dry NEUROLOGIC:  Alert and oriented x  3 PSYCHIATRIC:  Normal affect   ASSESSMENT:    1. Cellulitis and abscess of lower extremity   2. Acute on chronic systolic CHF (congestive heart failure) (HCC)   3. Primary hypertension   4. Paroxysmal atrial fibrillation (HCC)    PLAN:    In order of problems listed above:  Lower leg swelling, right sided Suspected cellulitis.  Reports 1 week of progressive right sided lower leg swelling, now tender to the touch with some heat. Patient was discharged with antibiotics from the hospital for cellulitis of the same sife. She denies fever or chills. I will prescribe doxycyline 100mg  BID for 7 days and recommend she follow up with PCP. I will also get DVT study on the right side.   HFrEF Echo 05/2021 showed LVEF 20-25%, G1DD. Right sided lower leg swelling as above. Will give abx for possible cellulitis. No Swelling in the left side. Continue lasix 20mg  daily. Not a candidate for ischemic evaluation or advanced therapies such as ICD given advanced age and DNR status. Continue Toprol and Losartan. Continue GDMT at follow-up.    Afib EKG shows EKG shows NSR with sinus arrhythmia, LAD, TWI AVL. CHADSVASC of at least 5. A/c was held given increased fall risk, can discuss further with MD. Continue rate control with Lopressor.   Elevated troponin This was elevated in the hospital to 178, not suspected ACS. Not a candidate for ischemic work-up given age and DNR status. Continue ASA, BB, ARB.   Disposition: Follow up in 6 week(s) with MD/APP   Signed, Khamille Beynon , PA-C  06/21/2021 4:02 PM    [facility]

## 2021-06-21 NOTE — Patient Instructions (Addendum)
Medication Instructions:  - Your physician has recommended you make the following change in your medication:   1) START doxycycline 100 mg- take 1 tablet by mouth TWICE daily x 7 days   *If you need a refill on your cardiac medications before your next appointment, please call your pharmacy*   Lab Work: - none ordered  If you have labs (blood work) drawn today and your tests are completely normal, you will receive your results only by: [place] (if you have MyChart) OR A paper copy in the mail If you have any lab test that is abnormal or we need to change your treatment, we will call you to review the results.   Testing/Procedures: - none ordered   Follow-Up: At [facility], you and your health needs are our priority.  As part of our continuing mission to provide you with exceptional heart care, we have created designated Provider Care Teams.  These Care Teams include your primary Cardiologist (physician) and Advanced Practice Providers (APPs -  Physician Assistants and Nurse Practitioners) who all work together to provide you with the care you need, when you need it.  We recommend signing up for the patient portal called "MyChart".  Sign up information is provided on this After Visit Summary.  MyChart is used to connect with patients for Virtual Visits (Telemedicine).  Patients are able to view lab/test results, encounter notes, upcoming appointments, etc.  Non-urgent messages can be sent to your provider as well.   To learn more about what you can do with MyChart, go to [email or web address].    Your next appointment:   6 week(s)  The format for your next appointment:   In Person  Provider:   You may see Kathlyn [place], MD or one of the following Advanced Practice Providers on your designated Care Team:   Murray Hodgkins, NP Christell Faith, PA-C Marrianne Mood, PA-C Cadence Kathlen Mody, [place]   Other Instructions 1) Please follow up with your Primary Care Doctor for  possible cellulits   Doxycycline Capsules or Tablets What is this medication? DOXYCYCLINE (dox i SYE kleen) treats infections caused by bacteria. It belongs to a group of medications called tetracycline antibiotics. It will not treat colds, the flu, or infections caused by viruses. This medicine may be used for other purposes; ask your health care provider or pharmacist if you have questions. COMMON BRAND NAME(S): Acticlate, Adoxa, Adoxa CK, Adoxa Pak, Adoxa TT, Alodox, Avidoxy, Doxal, LYMEPAK, Mondoxyne NL, Monodox, Morgidox 1x, Morgidox 1x Kit, Morgidox 2x, Morgidox 2x Kit, NutriDox, Ocudox, Parker, Hamilton, Gilbert, Vibra-Tabs, Vibramycin What should I tell my care team before I take this medication? They need to know if you have any of these conditions: Kidney disease Liver disease Long exposure to sunlight like working outdoors Recent stomach surgery Stomach or intestine problems such as colitis Vision Problems Yeast or fungal infection of the mouth or vagina An unusual or allergic reaction to doxycycline, tetracycline antibiotics, other medications, foods, dyes, or preservatives Pregnant or trying to get pregnant Breast-feeding How should I use this medication? Take this medication by mouth with water. Take it as directed on the prescription label at the same time every day. It is best to take this medication without food, but if it upsets your stomach take it with food. Take all of this medication unless your care team tells you to stop it early. Keep taking it even if you think you are better. Take antacids and products with aluminum, calcium, magnesium, iron, and zinc in  them at a different time of day than this medication. Talk to your care team if you have questions. Talk to your care team regarding the use of this medication in children. While this medication may be prescribed for selected conditions, precautions do apply. Overdosage: If you think you have taken too much of this  medicine contact a poison control center or emergency room at once. NOTE: This medicine is only for you. Do not share this medicine with others. What if I miss a dose? If you miss a dose, take it as soon as you can. If it is almost time for your next dose, take only that dose. Do not take double or extra doses. What may interact with this medication? Antacids, vitamins, or other products that contain aluminum, calcium, iron, magnesium, or zinc Barbiturates Birth control pills Bismuth subsalicylate Carbamazepine Methoxyflurane Oral retinoids such as acitretin, isotretinoin Other antibiotics Phenytoin Warfarin This list may not describe all possible interactions. Give your health care provider a list of all the medicines, herbs, non-prescription drugs, or dietary supplements you use. Also tell them if you smoke, drink alcohol, or use illegal drugs. Some items may interact with your medicine. What should I watch for while using this medication? Tell your care team if your symptoms do not improve. Do not treat diarrhea with over the counter products. Contact your care team if you have diarrhea that lasts more than 2 days or if it is severe and watery. Do not take this medication just before going to bed. It may not dissolve properly when you lay down and can cause pain in your throat. Drink plenty of fluids while taking this medication to also help reduce irritation in your throat. This medication can make you more sensitive to the sun. Keep out of the sun. If you cannot avoid being in the sun, wear protective clothing and use sunscreen. Do not use sun lamps or tanning beds/booths. Birth control pills may not work properly while you are taking this medication. Talk to your care team about using an extra method of birth control. If you are being treated for a sexually transmitted infection, avoid sexual contact until you have finished your treatment. Your sexual partner may also need treatment. If  you are using this medication to prevent malaria, you should still protect yourself from contact with mosquitos. Stay in screened-in areas, use mosquito nets, keep your body covered, and use an insect repellent. What side effects may I notice from receiving this medication? Side effects that you should report to your care team as soon as possible: Allergic reactions-skin rash, itching, hives, swelling of the face, lips, tongue, or throat Increased pressure around the brain-severe headache, change in vision, blurry vision, nausea, vomiting Joint pain Pain or trouble swallowing Redness, blistering, peeling, or loosening of the skin, including inside the mouth Severe diarrhea, fever Unusual vaginal discharge, itching, or odor Side effects that usually do not require medical attention (report these to your care team if they continue or are bothersome): Change in tooth color Diarrhea Headache Heartburn Nausea This list may not describe all possible side effects. Call your doctor for medical advice about side effects. You may report side effects to FDA at 1-800-FDA-1088. Where should I keep my medication? Keep out of the reach of children and pets. Store at room temperature, below 30 degrees C (86 degrees F). Protect from light. Keep container tightly closed. Throw away any unused medication after the expiration date. Taking this medication after the expiration date can  make you seriously ill. NOTE: This sheet is a summary. It may not cover all possible information. If you have questions about this medicine, talk to your doctor, pharmacist, or health care provider.  2022 Elsevier/Gold Standard (2020-10-29 14:12:28)

## 2021-06-22 ENCOUNTER — Ambulatory Visit: Payer: Medicare Other | Admitting: Medical

## 2021-06-24 ENCOUNTER — Ambulatory Visit: Payer: Medicare Other | Admitting: Nurse Practitioner

## 2021-06-24 ENCOUNTER — Telehealth: Payer: Self-pay | Admitting: Medical

## 2021-06-24 DIAGNOSIS — R600 Localized edema: Secondary | ICD-10-CM

## 2021-06-24 NOTE — Telephone Encounter (Signed)
Furth, Cadence H, PA-C  P Cv Div [facility]url Triage Can we please order DVT study for right leg. I saw her Monday and had Right LLE, suspected moreso cellulitis. But lets get a DVT study just to be safe. Thanks

## 2021-06-24 NOTE — Telephone Encounter (Signed)
Scheduled 10/14 at 4p

## 2021-06-25 ENCOUNTER — Other Ambulatory Visit: Payer: Self-pay

## 2021-06-25 ENCOUNTER — Ambulatory Visit (INDEPENDENT_AMBULATORY_CARE_PROVIDER_SITE_OTHER): Payer: Medicare Other

## 2021-06-25 DIAGNOSIS — R600 Localized edema: Secondary | ICD-10-CM | POA: Diagnosis not present

## 2021-07-30 ENCOUNTER — Ambulatory Visit: Payer: Medicare Other | Admitting: Medical

## 2022-04-06 ENCOUNTER — Encounter: Payer: Self-pay | Admitting: Medical

## 2022-04-06 ENCOUNTER — Ambulatory Visit (INDEPENDENT_AMBULATORY_CARE_PROVIDER_SITE_OTHER): Payer: Medicare Other | Admitting: Medical

## 2022-04-06 VITALS — BP 124/70 | Ht <= 58 in | Wt 88.2 lb

## 2022-04-06 DIAGNOSIS — I5042 Chronic combined systolic (congestive) and diastolic (congestive) heart failure: Secondary | ICD-10-CM | POA: Diagnosis not present

## 2022-04-06 DIAGNOSIS — I5022 Chronic systolic (congestive) heart failure: Secondary | ICD-10-CM | POA: Diagnosis not present

## 2022-04-06 DIAGNOSIS — I480 Paroxysmal atrial fibrillation: Secondary | ICD-10-CM | POA: Diagnosis not present

## 2022-04-06 MED ORDER — EMPAGLIFLOZIN 10 MG PO TABS: 10.0000 mg | ORAL_TABLET | Freq: Every day | ORAL | 5 refills | Status: AC

## 2022-04-06 MED ORDER — LOSARTAN POTASSIUM 25 MG PO TABS: 12.5000 mg | ORAL_TABLET | Freq: Two times a day (BID) | ORAL | 5 refills | Status: AC

## 2022-04-06 MED ORDER — FUROSEMIDE 40 MG PO TABS: ORAL_TABLET | ORAL | 5 refills | Status: AC

## 2022-04-06 MED ORDER — METOPROLOL SUCCINATE ER 25 MG PO TB24: 12.5000 mg | ORAL_TABLET | Freq: Every day | ORAL | 5 refills | Status: AC

## 2022-04-06 NOTE — Patient Instructions (Signed)
Medication Instructions:  Your physician has recommended you make the following change in your medication:   START Jardiance 10 mg daily. An Rx has been sent to your pharmacy.  Your cardiac medications have been refilled today  *If you need a refill on your cardiac medications before your next appointment, please call your pharmacy*   Lab Work: Your physician recommends that you return for lab work (bmp) in: 1 week  Please have your lab drawn at the [facility] No appt needed. Lab hours are Mon-Fri 7am-6pm. Stop at the Registration desk to check in.   If you have labs (blood work) drawn today and your tests are completely normal, you will receive your results only by: MyChart Message (if you have MyChart) OR A paper copy in the mail If you have any lab test that is abnormal or we need to change your treatment, we will call you to review the results.   Testing/Procedures: None ordered   Follow-Up: At [facility], you and your health needs are our priority.  As part of our continuing mission to provide you with exceptional heart care, we have created designated Provider Care Teams.  These Care Teams include your primary Cardiologist (physician) and Advanced Practice Providers (APPs -  Physician Assistants and Nurse Practitioners) who all work together to provide you with the care you need, when you need it.  We recommend signing up for the patient portal called "MyChart".  Sign up information is provided on this After Visit Summary.  MyChart is used to connect with patients for Virtual Visits (Telemedicine).  Patients are able to view lab/test results, encounter notes, upcoming appointments, etc.  Non-urgent messages can be sent to your provider as well.   To learn more about what you can do with MyChart, go to [email or web address].    Your next appointment:   Your physician wants you to follow-up in: 1 year You will receive a reminder letter in the mail two months in  advance. If you don't receive a letter, please call our office to schedule the follow-up appointment.   The format for your next appointment:   In Person  Provider:   You may see Lorine Bears, MD or one of the following Advanced Practice Providers on your designated Care Team:   Nicolasa Ducking, NP Eula Listen, PA-C Cadence Fransico Michael, PA-C{     Other Instructions N/A  Important Information About Sugar

## 2022-04-06 NOTE — Progress Notes (Signed)
Cardiology Office Note:    Date:  04/06/2022   ID:  Patricia Bradford, DOB [birth year], MRN [id number]  PCP:  Sherrie Mustache, MD  [facility] Cardiologist: Dr. Cathey [facility] Electrophysiologist:  None   Referring MD: Sherrie Mustache, MD   Chief Complaint: 1 year follow-up  History of Present Illness:    Patricia Bradford is a 88 y.o. female with a hx of HFrEF presumed to be dilated NICM (no prior ischemic evaluation for review), Paroxysmal Afib not on a/c, HTN, pulmonary nodules, and underweight who is being seen today for 1 year follow-up   Patricia Bradford was diagnosed with HFrEF in 06/2019 during hospital admission with echo at that time showing an EF of 20-25%, global hypokinesis, diastolic dysfunction, normal RV ssytolic function and ventricular cavity size, normal size left atrium, and mild to moderate mitral regurgitation.  She was conservatively managed by the primary service at that time.    She was admitted to the hospital in [facility] in 12/2020 with acute on chronic HFrEF in the setting of hypertensive crisis. Echo at that time showed an EF of ~ 20-30%, global hypokinesis with septal wall motion abnormality due to LBBB, Gr1DD, mild left atrial dilatation, normal RVSF and ventricular cavity size, and no significant valvular abnormalities. SYmptoms improved with diuresis. Per cardiology during that admission, she was not candidate for invasive procedure nor was she a candidate for escalation of GDMT given advanced age and marginal BP. Notes do not indicate Afib was present at that time. Discharge weight of 90 pounds.    Admitted 9/24-9/27 with new onset Afib, acute CHF, leukocytosis, elevated troponin. She was diuresed and transitioned to lasix. Not a candidate for ischeimc evaluation or ICD given advanced age and DNR status. CHADSVASC at least 5 however she was not started on [facility] given fall risk.  Last seen 06/2021 and was having swelling in the right foot with redness. She was given  antibiotics for cellulitis.  Today, family member reports patient is overall doing well. No significant changes. Says weight is not dropping the same with the fluid pill. Seems it only drops 1 lb instead of 2lbs. She is taking lasix 20mg  twice weekly. Bps are good. No chest pain or SOB. No LLE. She walks with a cane.   Past Medical History:  Diagnosis Date   Chronic systolic congestive heart failure (HCC)    HTN (hypertension)     Past Surgical History:  Procedure Laterality Date   kidney stone N/A     Current Medications: Current Meds  Medication Sig   aspirin EC 81 MG tablet Take 81 mg by mouth daily.   Cranberry 400 MG CAPS Take 400 mg by mouth daily.   doxycycline (VIBRA-TABS) 100 MG tablet Take 1 tablet (100 mg) by mouth twice daily x 7 days   empagliflozin (JARDIANCE) 10 MG TABS tablet Take 1 tablet (10 mg total) by mouth daily before breakfast.   Multiple Vitamin (MULTI-VITAMIN) tablet Take 1 tablet by mouth daily.   [DISCONTINUED] furosemide (LASIX) 20 MG tablet Take 1 tablet (20 mg total) by mouth once a week. If weight increasing or swelling, take additional 20 mg daily as needed. (Patient taking differently: Take 20 mg by mouth. If weight increasing or swelling, take additional 20 mg daily as needed. Taking Twice a Week)   [DISCONTINUED] furosemide (LASIX) 40 MG tablet Take by mouth. Twice a week   [DISCONTINUED] losartan (COZAAR) 25 MG tablet Take 0.5 tablets (12.5 mg total) by mouth 2 (two)  times daily.   [DISCONTINUED] metoprolol succinate (TOPROL-XL) 25 MG 24 hr tablet Take 0.5 tablets (12.5 mg total) by mouth daily.     Allergies:   Patient has no known allergies.   Social History   Socioeconomic History   Marital status: Widowed    Spouse name: Not on file   Number of children: Not on file   Years of education: Not on file   Highest education level: Not on file  Occupational History   Not on file  Tobacco Use   Smoking status: Never   Smokeless tobacco:  Never  Vaping Use   Vaping Use: Never used  Substance and Sexual Activity   Alcohol use: Never   Drug use: Never   Sexual activity: Not on file  Other Topics Concern   Not on file  Social History Narrative   Not on file   Social Determinants of Health   Financial Resource Strain: Not on file  Food Insecurity: Not on file  Transportation Needs: Not on file  Physical Activity: Not on file  Stress: Not on file  Social Connections: Not on file     Family History: The patient's family history includes Colon cancer in her sister.  ROS:   Please see the history of present illness.     All other systems reviewed and are negative.  EKGs/Labs/Other Studies Reviewed:    The following studies were reviewed today:  2D echo 06/2019: 1. Left ventricular ejection fraction, by visual estimation, is 20 to  25%. The left ventricle has severely decreased function. Normal left  ventricular size. There is no left ventricular hypertrophy.Global  hypokinesis.   2. Left ventricular diastolic Doppler parameters are consistent with  pseudonormalization pattern of LV diastolic filling.   3. Global right ventricle has normal systolic function.The right  ventricular size is normal. No increase in right ventricular wall  thickness.   4. Left atrial size was normal.   5. Mild to moderate mitral valve regurgitation.   6. TR signal is inadequate for assessing pulmonary artery systolic  pressure.  __________   2D echo 12/2020 [facility]): Mildly dilated left ventricle with severe systolic dysfunction.    Left ventricular ejection fraction estimated at 29% by 3D;  20% by  Simpson's biplane.  Global hypokinesis with paradoxical septal motion due to left bundle  branch block.  Normal left ventricular wall wall thickness.  Grade I diastolic dysfunction (Impaired relaxation) with elevated  LA  pressure.  Mild left atrial dilation.  Normal right ventricular size and systolic function.  No  significant valvular abnormalities.  Normal central venous pressure.  Indeterminate PA systolic pressure due to  lack of TR jet.  No prior studies for comparison.  __________   2D echo 06/07/2021: 1. Left ventricular ejection fraction, by estimation, is 20 to 25%. The  left ventricle has severely decreased function. The left ventricle  demonstrates global hypokinesis. The left ventricular internal cavity size  was mildly dilated. Left ventricular  diastolic parameters are consistent with Grade I diastolic dysfunction  (impaired relaxation).   2. Right ventricular systolic function is normal. The right ventricular  size is normal. Tricuspid regurgitation signal is inadequate for assessing  PA pressure.   3. The mitral valve is normal in structure. Mild mitral valve  regurgitation. No evidence of mitral stenosis. Moderate mitral annular  calcification.   4. The aortic valve is normal in structure. Aortic valve regurgitation is  not visualized. Mild to moderate aortic valve sclerosis/calcification is  present,  without any evidence of aortic stenosis.   5. The inferior vena cava is normal in size with greater than 50%  respiratory variability, suggesting right atrial pressure of 3 mmHg.  EKG:  EKG is ordered today.  The ekg ordered today demonstrates NSR, 70bpm, LAD, LBBB, nonspecific T wave changes  Recent Labs: 06/05/2021: ALT 13; B Natriuretic Peptide 1,448.1 06/07/2021: Magnesium 2.5; TSH 1.777 06/08/2021: BUN 40; Creatinine, Ser 0.91; Hemoglobin 11.7; Platelets 136; Potassium 4.2; Sodium 137  Recent Lipid Panel    Component Value Date/Time   CHOL 153 06/06/2021 0614   TRIG 33 06/06/2021 0614   HDL 70 06/06/2021 0614   CHOLHDL 2.2 06/06/2021 0614   VLDL 7 06/06/2021 0614   LDLCALC 76 06/06/2021 0614   Physical Exam:    VS:  BP 124/70 (BP Location: Left Arm, Patient Position: Sitting, Cuff Size: Normal)   Ht 4\' 10"  (1.473 m)   Wt 88 lb 3.2 oz (40 kg)   SpO2 95%   BMI 18.43  kg/m     Wt Readings from Last 3 Encounters:  04/06/22 88 lb 3.2 oz (40 kg)  06/21/21 92 lb 8 oz (42 kg)  06/08/21 99 lb 10.4 oz (45.2 kg)     GEN:  Well nourished, well developed in no acute distress HEENT: Normal NECK: No JVD; No carotid bruits LYMPHATICS: No lymphadenopathy CARDIAC: RRR, no murmurs, rubs, gallops RESPIRATORY:  Clear to auscultation without rales, wheezing or rhonchi  ABDOMEN: Soft, non-tender, non-distended MUSCULOSKELETAL:  No edema; No deformity  SKIN: Warm and dry NEUROLOGIC:  Alert and oriented x 3 PSYCHIATRIC:  Normal affect   ASSESSMENT:    1. Chronic combined systolic and diastolic heart failure (HCC)   2. Chronic systolic heart failure (HCC)   3. Paroxysmal A-fib (HCC)    PLAN:    In order of problems listed above:  HFrEF Echo 05/2021 showed LVEF 20-25%, G1DD. She is euvolemic on exam today. She is taking lasix 20mg  twice weekly. She is not a candidate for ischemic evaluation of advanced therapies such as ICD given advanced age and DNR status. Continue Toprol and Losartan. WE will refill these. I will start Jardiance 10mg  daily, BMET in 1 week.  Paroxysmal Afib EKG today showed NSR with a heart rate of 70bpm. She denies signifigant palpitations. She is only on Aspirin for elevated fall risk.   Disposition: Follow up in 1 year(s) with MD/APP      Signed, Aleina Burgio 06/2021, PA-C  04/06/2022 4:22 PM    [facility]

## 2022-04-11 ENCOUNTER — Emergency Department (HOSPITAL_COMMUNITY): Payer: Medicare Other

## 2022-04-11 ENCOUNTER — Inpatient Hospital Stay (HOSPITAL_COMMUNITY)
Admission: EM | Admit: 2022-04-11 | Discharge: 2022-05-13 | DRG: 951 | Disposition: E | Payer: Medicare Other | Attending: Internal Medicine | Admitting: Internal Medicine

## 2022-04-11 DIAGNOSIS — I63131 Cerebral infarction due to embolism of right carotid artery: Secondary | ICD-10-CM | POA: Diagnosis present

## 2022-04-11 DIAGNOSIS — I639 Cerebral infarction, unspecified: Secondary | ICD-10-CM | POA: Diagnosis not present

## 2022-04-11 DIAGNOSIS — H538 Other visual disturbances: Secondary | ICD-10-CM | POA: Diagnosis present

## 2022-04-11 DIAGNOSIS — Z515 Encounter for palliative care: Principal | ICD-10-CM

## 2022-04-11 DIAGNOSIS — R4020 Unspecified coma: Secondary | ICD-10-CM | POA: Diagnosis present

## 2022-04-11 DIAGNOSIS — I6521 Occlusion and stenosis of right carotid artery: Secondary | ICD-10-CM

## 2022-04-11 DIAGNOSIS — I110 Hypertensive heart disease with heart failure: Secondary | ICD-10-CM | POA: Diagnosis present

## 2022-04-11 DIAGNOSIS — R29810 Facial weakness: Secondary | ICD-10-CM | POA: Diagnosis present

## 2022-04-11 DIAGNOSIS — Z79899 Other long term (current) drug therapy: Secondary | ICD-10-CM

## 2022-04-11 DIAGNOSIS — M79601 Pain in right arm: Secondary | ICD-10-CM | POA: Diagnosis present

## 2022-04-11 DIAGNOSIS — I480 Paroxysmal atrial fibrillation: Secondary | ICD-10-CM

## 2022-04-11 DIAGNOSIS — Z800 Family history of malignant neoplasm of digestive organs: Secondary | ICD-10-CM

## 2022-04-11 DIAGNOSIS — I10 Essential (primary) hypertension: Secondary | ICD-10-CM | POA: Diagnosis present

## 2022-04-11 DIAGNOSIS — R54 Age-related physical debility: Secondary | ICD-10-CM | POA: Diagnosis present

## 2022-04-11 DIAGNOSIS — Z66 Do not resuscitate: Secondary | ICD-10-CM | POA: Diagnosis present

## 2022-04-11 DIAGNOSIS — Z7982 Long term (current) use of aspirin: Secondary | ICD-10-CM

## 2022-04-11 DIAGNOSIS — I63511 Cerebral infarction due to unspecified occlusion or stenosis of right middle cerebral artery: Secondary | ICD-10-CM | POA: Diagnosis not present

## 2022-04-11 DIAGNOSIS — Z823 Family history of stroke: Secondary | ICD-10-CM

## 2022-04-11 DIAGNOSIS — I5042 Chronic combined systolic (congestive) and diastolic (congestive) heart failure: Secondary | ICD-10-CM | POA: Insufficient documentation

## 2022-04-11 DIAGNOSIS — G8194 Hemiplegia, unspecified affecting left nondominant side: Secondary | ICD-10-CM | POA: Diagnosis present

## 2022-04-11 DIAGNOSIS — Z681 Body mass index (BMI) 19 or less, adult: Secondary | ICD-10-CM

## 2022-04-11 DIAGNOSIS — R29714 NIHSS score 14: Secondary | ICD-10-CM | POA: Diagnosis present

## 2022-04-11 DIAGNOSIS — E43 Unspecified severe protein-calorie malnutrition: Secondary | ICD-10-CM | POA: Diagnosis present

## 2022-04-11 DIAGNOSIS — Z7984 Long term (current) use of oral hypoglycemic drugs: Secondary | ICD-10-CM

## 2022-04-11 LAB — CBC
HCT: 38.3 % (ref 36.0–46.0)
Hemoglobin: 12 g/dL (ref 12.0–15.0)
MCH: 30.4 pg (ref 26.0–34.0)
MCHC: 31.3 g/dL (ref 30.0–36.0)
MCV: 97 fL (ref 80.0–100.0)
Platelets: 146 10*3/uL — ABNORMAL LOW (ref 150–400)
RBC: 3.95 MIL/uL (ref 3.87–5.11)
RDW: 13.1 % (ref 11.5–15.5)
WBC: 6.5 10*3/uL (ref 4.0–10.5)
nRBC: 0 % (ref 0.0–0.2)

## 2022-04-11 LAB — I-STAT CHEM 8, ED
BUN: 19 mg/dL (ref 8–23)
Calcium, Ion: 1.09 mmol/L — ABNORMAL LOW (ref 1.15–1.40)
Chloride: 100 mmol/L (ref 98–111)
Creatinine, Ser: 0.6 mg/dL (ref 0.44–1.00)
Glucose, Bld: 88 mg/dL (ref 70–99)
HCT: 36 % (ref 36.0–46.0)
Hemoglobin: 12.2 g/dL (ref 12.0–15.0)
Potassium: 4.6 mmol/L (ref 3.5–5.1)
Sodium: 135 mmol/L (ref 135–145)
TCO2: 25 mmol/L (ref 22–32)

## 2022-04-11 LAB — DIFFERENTIAL
Abs Immature Granulocytes: 0.02 10*3/uL (ref 0.00–0.07)
Basophils Absolute: 0 10*3/uL (ref 0.0–0.1)
Basophils Relative: 0 %
Eosinophils Absolute: 0.1 10*3/uL (ref 0.0–0.5)
Eosinophils Relative: 2 %
Immature Granulocytes: 0 %
Lymphocytes Relative: 37 %
Lymphs Abs: 2.4 10*3/uL (ref 0.7–4.0)
Monocytes Absolute: 0.5 10*3/uL (ref 0.1–1.0)
Monocytes Relative: 8 %
Neutro Abs: 3.4 10*3/uL (ref 1.7–7.7)
Neutrophils Relative %: 53 %

## 2022-04-11 LAB — COMPREHENSIVE METABOLIC PANEL
ALT: 11 U/L (ref 0–44)
AST: 20 U/L (ref 15–41)
Albumin: 3 g/dL — ABNORMAL LOW (ref 3.5–5.0)
Alkaline Phosphatase: 49 U/L (ref 38–126)
Anion gap: 5 (ref 5–15)
BUN: 17 mg/dL (ref 8–23)
CO2: 27 mmol/L (ref 22–32)
Calcium: 8.2 mg/dL — ABNORMAL LOW (ref 8.9–10.3)
Chloride: 103 mmol/L (ref 98–111)
Creatinine, Ser: 0.7 mg/dL (ref 0.44–1.00)
GFR, Estimated: >60 mL/min (ref >60)
Glucose, Bld: 99 mg/dL (ref 70–99)
Potassium: 4.7 mmol/L (ref 3.5–5.1)
Sodium: 135 mmol/L (ref 135–145)
Total Bilirubin: 0.5 mg/dL (ref 0.3–1.2)
Total Protein: 5.6 g/dL — ABNORMAL LOW (ref 6.5–8.1)

## 2022-04-11 LAB — CBG MONITORING, ED: Glucose-Capillary: 85 mg/dL (ref 70–99)

## 2022-04-11 LAB — APTT: aPTT: 26 seconds (ref 24–36)

## 2022-04-11 LAB — PROTIME-INR
INR: 1.1 (ref 0.8–1.2)
Prothrombin Time: 14.5 seconds (ref 11.4–15.2)

## 2022-04-11 LAB — ETHANOL: Alcohol, Ethyl (B): <10 mg/dL (ref <10)

## 2022-04-11 MED ORDER — HALOPERIDOL 0.5 MG PO TABS: 0.5000 mg | ORAL_TABLET | ORAL | Status: DC | PRN

## 2022-04-11 MED ORDER — IOHEXOL 350 MG/ML SOLN
40.0000 mL | Freq: Once | INTRAVENOUS | Status: AC | PRN
  Administered 2022-04-11: 40 mL via INTRAVENOUS

## 2022-04-11 MED ORDER — HALOPERIDOL LACTATE 2 MG/ML PO CONC: 0.5000 mg | ORAL | Status: DC | PRN

## 2022-04-11 MED ORDER — SODIUM CHLORIDE 0.9% FLUSH
3.0000 mL | Freq: Once | INTRAVENOUS | Status: AC
  Administered 2022-04-11: 3 mL via INTRAVENOUS

## 2022-04-11 MED ORDER — ACETAMINOPHEN 650 MG RE SUPP: 650.0000 mg | Freq: Four times a day (QID) | RECTAL | Status: DC | PRN

## 2022-04-11 MED ORDER — TENECTEPLASE FOR STROKE
0.2500 mg/kg | PACK | Freq: Once | INTRAVENOUS | Status: AC
  Administered 2022-04-11: 10 mg via INTRAVENOUS
  Filled 2022-04-11: qty 10

## 2022-04-11 MED ORDER — IOHEXOL 350 MG/ML SOLN
75.0000 mL | Freq: Once | INTRAVENOUS | Status: AC | PRN
  Administered 2022-04-11: 75 mL via INTRAVENOUS

## 2022-04-11 MED ORDER — ONDANSETRON 4 MG PO TBDP: 4.0000 mg | ORAL_TABLET | Freq: Four times a day (QID) | ORAL | Status: DC | PRN

## 2022-04-11 MED ORDER — ACETAMINOPHEN 325 MG PO TABS: 650.0000 mg | ORAL_TABLET | Freq: Four times a day (QID) | ORAL | Status: DC | PRN

## 2022-04-11 MED ORDER — ONDANSETRON HCL 4 MG/2ML IJ SOLN: 4.0000 mg | Freq: Four times a day (QID) | INTRAMUSCULAR | Status: DC | PRN

## 2022-04-11 MED ORDER — MORPHINE SULFATE (PF) 2 MG/ML IV SOLN
1.0000 mg | INTRAVENOUS | Status: DC | PRN
  Administered 2022-04-12: 1 mg via INTRAVENOUS
  Filled 2022-04-11: qty 1

## 2022-04-11 MED ORDER — HALOPERIDOL LACTATE 5 MG/ML IJ SOLN: 0.5000 mg | INTRAMUSCULAR | Status: DC | PRN

## 2022-04-11 MED ORDER — LORAZEPAM 2 MG/ML IJ SOLN: 1.0000 mg | INTRAMUSCULAR | Status: DC | PRN

## 2022-04-11 NOTE — ED Triage Notes (Signed)
Pt bib [facility] from home as code stroke. Per daughter, pt was in kitchen making sandwich when pt became aphasic. LKN 1645, EMS noted L side weakness, R side gaze and aphasia upon arrival. Hx bells palsy + TIA  BP 146/76 HR 76 SPO2 98% RA CBG 87

## 2022-04-11 NOTE — Code Documentation (Addendum)
Responded to Code Stroke called at [facility] for L sided weakness, slurred, and R sided gaze, [id number]. Pt arrived at 1957, CBG-85, NIH-14, CT head negative for acute changes. TNK given at 2010. 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 No reconstituted flow in prox R M1 segment. 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 No R PCA, patent ACA which gives some supply to R ACA. CTP-228cc penumbra with 40cc core infarct. Case discussed with Abran Richard MD and dtr. Decision made not to pursue IR interventions. Plan q25min NIH/VS x 2 hours, q47min x 6 hours, then q1h x 16 h.

## 2022-04-11 NOTE — Consult Note (Signed)
NEUROLOGY CONSULTATION NOTE   Date of service: April 11, 2022 Patient Name: Patricia Bradford MRN:  [id number] DOB:  [birth year] Reason for consult: "L sided weakness with R gaze preference" Requesting Provider: Pricilla Loveless, MD _ _ _   _ __   _ __ _ _  __ __   _ __   __ _  History of Present Illness  Jasmeen E Beachem is a 88 y.o. female with PMH significant for pAfibb not on [facility], CHF, HTN who presents with sudden onset L sided weakness and R gaze deviation. She was with family and fixing herself a sandwich at 74. [place]rotly afterwards, was found with symptoms. Family called EMS and she was brought in as a stroke code.  CTH w/o contrast was negative for a large hypodensity concerning for a large territory infarct or hyperdensity concerning for an ICH. CTA with R ICA occlusion at the skull base with poor flow in R MCA M1 but distal reconsituted flow in M2 branches likely colateral from meningeal branches. CT Perfusion was also obtained given the noted reconstituted flow in the R M2 branches and thus question regarding how acute the noted R ICA occlusion was.  LKW: 1845 mRS: 0(makes her bed, independent, walks with a cane, dresses and undresses herselft, showers independently, does her own finances) tNKASE: case discussed with patient's daughter Lavone Neri and dicussed details of adminstration along with risks and benefits of tnkase. Also discussed elevated risk of ICH given advanced age. Patient's daughter consented to tnkase administration which was given at 2009. Thrombectomy: Case was discussed with Dr. Pamalee Leyden with the IR team at [facility] given we had a thrombectomy patient on the table at [facility]. Discussed patient presentation, imaging including R ICA occlusion at the skull base with poor flow in R MCA M1 but distal reconsituted flow in M2 branches likely colateral from meningeal branches. Discussed CT Perfusion including core of 6ml along with mismatch of . Dr. Manson Passey  spoke with patient's daughter over phone and discussed large core and felt unlikely to return to her prior baseline. Daughter declined intervention and opted to go comfort care.  NIHSS components Score: Comment  1a Level of Conscious 0[x]  1[]  2[]  3[]      1b LOC Questions 0[]  1[]  2[x]       1c LOC Commands 0[x]  1[]  2[]       2 Best Gaze 0[]  1[]  2[x]       3 Visual 0[x]  1[]  2[]  3[]      4 Facial Palsy 0[]  1[x]  2[]  3[]      5a Motor Arm - left 0[]  1[]  2[]  3[x]  4[]  UN[]    5b Motor Arm - Right 0[]  1[x]  2[]  3[]  4[]  UN[]    6a Motor Leg - Left 0[]  1[]  2[x]  3[]  4[]  UN[]    6b Motor Leg - Right 0[]  1[x]  2[]  3[]  4[]  UN[]    7 Limb Ataxia 0[x]  1[]  2[]  3[]  UN[]     8 Sensory 0[x]  1[]  2[]  UN[]      9 Best Language 0[]  1[x]  2[]  3[]      10 Dysarthria 0[]  1[x]  2[]  UN[]      11 Extinct. and Inattention 0[x]  1[]  2[]       TOTAL: 14      ROS   Unable to obtain detailed ROS due to the acuity of the situation and concern for aphasia.  Past History   Past Medical History:  Diagnosis Date   Chronic systolic congestive heart failure (HCC)    HTN (hypertension)    Past Surgical  History:  Procedure Laterality Date   kidney stone N/A    Family History  Problem Relation Age of Onset   Colon cancer Sister    Social History   Socioeconomic History   Marital status: Widowed    Spouse name: Not on file   Number of children: Not on file   Years of education: Not on file   Highest education level: Not on file  Occupational History   Not on file  Tobacco Use   Smoking status: Never   Smokeless tobacco: Never  Vaping Use   Vaping Use: Never used  Substance and Sexual Activity   Alcohol use: Never   Drug use: Never   Sexual activity: Not on file  Other Topics Concern   Not on file  Social History Narrative   Not on file   Social Determinants of Health   Financial Resource Strain: Not on file  Food Insecurity: Not on file  Transportation Needs: Not on file  Physical Activity: Not on file  Stress:  Not on file  Social Connections: Not on file   No Known Allergies  Medications  (Not in a hospital admission)    Vitals   Vitals:   2022-04-18 2005 April 18, 2022 2015 Apr 18, 2022 2030 2022-04-18 2046  BP: (!) 148/73 (!) 146/70 (!) 150/83 (!) 138/109  Pulse: 68 63 67 90  Resp: 13 15 14 19   Temp:    (!) 96.9 F (36.1 C)  SpO2: 100% 100% 99% 100%  Weight:         Body mass index is 18.98 kg/m.  Physical Exam   General: Laying comfortably in bed; in no acute distress.  HENT: Normal oropharynx and mucosa. Normal external appearance of ears and nose.  Neck: Supple, no pain or tenderness  CV: No JVD. No peripheral edema.  Pulmonary: Symmetric Chest rise. Normal respiratory effort.  Abdomen: Soft to touch, non-tender.  Ext: No cyanosis, edema, or deformity  Skin: No rash. Normal palpation of skin.   Musculoskeletal: Normal digits and nails by inspection. No clubbing.   Neurologic Examination  Mental status/Cognition: Alert, oriented to self, poor attention.  Speech/language: Non fluent, occasionally speaks in full sentences and able to count fingers. Name objects. Cranial nerves:   CN II Pupils equal and reactive to light, no VF deficits   CN III,IV,VI R gaze deviation.   CN V Corneals intact BL   CN VII L facial droop   CN VIII Hard of hearing.   CN IX & X normal palatal elevation, no uvular deviation    CN XI    CN XII midline tongue protrusion    Motor:  Muscle bulk: poor, tone flaccid in LUE. Unable to do detailed strength testing secondary to mild aphasia, hard of hearing and encephalopathy. LUE roughly 1-2/5. RUE: 4/5 LLE: 2-3/5 RLE: 3-4/5.  Sensation:  Light touch Does seem to localize to pinch in all extremities.   Pin prick    Temperature    Vibration   Proprioception    Coordination/Complex Motor:  Unable to assess.  Labs   CBC:  Recent Labs  Lab Apr 18, 2022 2040 04-18-22 2046  WBC 6.5  --   NEUTROABS 3.4  --   HGB 12.0 12.2  HCT 38.3 36.0  MCV 97.0   --   PLT 146*  --     Basic Metabolic Panel:  Lab Results  Component Value Date   NA 135 04-18-22   K 4.6 Apr 18, 2022   CO2 27 2022-04-18   GLUCOSE  88 03/18/2022   BUN 19 03/13/2022   CREATININE 0.60 03/16/2022   CALCIUM 8.2 (L) 03/31/2022   GFRNONAA >60 04/08/2022   GFRAA >60 06/30/2019   Lipid Panel:  Lab Results  Component Value Date   LDLCALC 76 06/06/2021   HgbA1c:  Lab Results  Component Value Date   HGBA1C 5.5 06/05/2021   Urine Drug Screen: No results found for: "LABOPIA", "COCAINSCRNUR", "LABBENZ", "AMPHETMU", "THCU", "LABBARB"  Alcohol Level     Component Value Date/Time   ETH <10 04/03/2022 2040    CT Head without contrast(Personally reviewed): CTH was negative for a large hypodensity concerning for a large territory infarct or hyperdensity concerning for an ICH  CT angio Head and Neck with contrast(Personally reviewed): R ICA occlusion at the skull base with poor flow in R MCA M1 but distal reconsituted flow in M2 branches likely colateral from meningeal branches.  CT Perfusion: Core of 40cc, mismatch of 128ml.  Impression   THASHA HOLLERN is a 88 y.o. female with PMH significant for pAfibb not on AC, CHF, HTN who presents with sudden onset L sided weakness and R gaze deviation. Found to have R ICA occlusion at the skull base with poor flow in R MCA M1 but distal reconsituted flow in M2 branches with core of 40cc and mismatch of 137ml in the R hemisphere. Her symptoms are concerning for a developing stroke in the R MCA/ R ACA distribution due to the R ICA and R MCA occlusion.  She was given tnkase after discussion of risks and benefits with daughter, including elevated risk of hemorrhage given her advanced age. She is functionally independent at baseline despite advanced age with an mRS of 0. Her presentation was discussed with IR team at [facility] but given core of 40, felt she will not return to her baseline and daughter declined thrombectomy and wants  patient to be transtitioned to full comfort care.  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   Secondary Diagnosis: Paroxysmal atrial fibrillation  Recommendations  - Transitioning to DNR Comfort care. We will signoff. ______________________________________________________________________  This patient is critically ill and at significant risk of neurological worsening, death and care requires constant monitoring of vital signs, hemodynamics,respiratory and cardiac monitoring, neurological assessment, discussion with family, other specialists and medical decision making of high complexity. I spent 50 minutes of neurocritical care time  in the care of  this patient. This was time spent independent of any time provided by nurse practitioner or PA.  Donnetta Simpers [facility] Pager Number [phone] 03/13/2022  9:58 PM  Thank you for the opportunity to take part in the care of this patient. If you have any further questions, please contact the neurology consultation attending.  Signed,  Luce Pager Number [phone] _ _ _   _ __   _ __ _ _  __ __   _ __   __ _

## 2022-04-11 NOTE — H&P (Signed)
History and Physical    Patient: Patricia Bradford KGY:[id number] DOB: 01-25-1916 DOA: 03/23/2022 DOS: the patient was seen and examined on 04/12/2022 PCP: Sherrie Mustache, MD  Patient coming from: Home  Chief Complaint:  Chief Complaint  Patient presents with   Code Stroke   HPI: Patricia Bradford is a 88 y.o. female with medical history significant of systolic CHF, HTN, protein malnutrition who presents with concerns of facial droop.  Patient is a very independent 88 year old who still makes her own bed and does crossword puzzles.  She was in the kitchen today around 6:45pm cutting up a tomato and about to make a sandwich when did not answer her daughter's question.  Her daughter then noticed her drooling with facial droop and decided to call EMS.  She presented as a code stroke. CT head was negative however CT perfusion showed right ICA occlusion, and infarct in the right frontal and parietal right matter. She received TNKase by neurology.  Neurology then discussed case with with Dr. Pamalee Leyden with the IR team at [facility] center for thrombectomy. He spoke with daughter over the phone and discussed the large core and the unlikelihood of patient to return to her baseline.  Daughter declined intervention and opted to go comfort care.  Hospitalist then consulted for comfort care management.  Review of Systems: unable to review all systems due to the inability of the patient to answer questions. Past Medical History:  Diagnosis Date   Chronic systolic congestive heart failure (HCC)    HTN (hypertension)    Past Surgical History:  Procedure Laterality Date   kidney stone N/A    Social History:  reports that she has never smoked. She has never used smokeless tobacco. She reports that she does not drink alcohol and does not use drugs.  No Known Allergies  Family History  Problem Relation Age of Onset   Colon cancer Sister     Prior to Admission medications   Medication Sig  Start Date End Date Taking? Authorizing Provider  aspirin EC 81 MG tablet Take 81 mg by mouth daily.    [provider]  Cranberry 400 MG CAPS Take 400 mg by mouth daily.    [provider]  doxycycline (VIBRA-TABS) 100 MG tablet Take 1 tablet (100 mg) by mouth twice daily x 7 days 06/21/21   Furth, Cadence H, PA-C  empagliflozin (JARDIANCE) 10 MG TABS tablet Take 1 tablet (10 mg total) by mouth daily before breakfast. 04/06/22   Furth, Cadence H, PA-C  feeding supplement, ENSURE ENLIVE, (ENSURE ENLIVE) LIQD Take 237 mLs by mouth 2 (two) times daily between meals. Patient not taking: Reported on 06/21/2021 06/30/19   Enedina Finner, MD  furosemide (LASIX) 40 MG tablet 1 tablet by mouth twice a week 04/06/22   Furth, Cadence H, PA-C  losartan (COZAAR) 25 MG tablet Take 0.5 tablets (12.5 mg total) by mouth 2 (two) times daily. 04/06/22   Furth, Cadence H, PA-C  metoprolol succinate (TOPROL-XL) 25 MG 24 hr tablet Take 0.5 tablets (12.5 mg total) by mouth daily. 04/06/22   Furth, Cadence H, PA-C  Multiple Vitamin (MULTI-VITAMIN) tablet Take 1 tablet by mouth daily.    [provider]    Physical Exam: Vitals:   03/31/2022 2300 04/02/2022 2315 03/27/2022 2330 04/12/22 0013  BP: 127/67 136/82 138/70 111/69  Pulse: 65 70 72 66  Resp: 19 16 17 18   Temp:    98.1 F (36.7 C)  TempSrc:    Oral  SpO2: 90% 94% 93% 93%  Weight:       Limited physical exam since patient is on comfort care and patient deferred exam.  Constitutional: Elderly female laying in bed with eyes closed but was picking at a skin lesion on her chin. Respiratory: Normal respirations without labored breathing Data Reviewed:  See HPI  Assessment and Plan: * Stroke [facility]) Patient presented with large cord infarct in the right frontal and parietal white matter.  She received TNKase by neurology.  She was eligible for thrombectomy but daughter has decided to proceed with comfort care due to the unlikelihood that  patient will return to her baseline. -comfort care measures with PRN medication for agitation, pain, seizure in place.  -palliative care consulted and will need hospice in the morning.      Advance Care Planning:   Code Status: DNR confirmed with family  Consults: Neurology  Family Communication: Discussed with daughter and son-in-law  Severity of Illness: The appropriate patient status for this patient is OBSERVATION. Observation status is judged to be reasonable and necessary in order to provide the required intensity of service to ensure the patient's safety. The patient's presenting symptoms, physical exam findings, and initial radiographic and laboratory data in the context of their medical condition is felt to place them at decreased risk for further clinical deterioration. Furthermore, it is anticipated that the patient will be medically stable for discharge from the hospital within 2 midnights of admission.   Author: Anselm Jungling, DO 04/12/2022 12:50 AM  For on call review [email or web address].

## 2022-04-11 NOTE — ED Notes (Signed)
This RN notified that family declined to transfer pt for IR. Jaquita Folds MD is aware

## 2022-04-11 NOTE — ED Provider Notes (Signed)
[facility] EMERGENCY DEPARTMENT Provider Note   CSN: [id number] Arrival date & time: 2022/05/06  7628  An emergency department physician performed an initial assessment on this suspected stroke patient at 75.  History  No chief complaint on file.   Patricia Bradford is a 87 y.o. female.  HPI 88 year old female presents as a code stroke. At 1845 she developed acute left sided weakness, gaze preference, decreased mental status. She is normally quite active at home.  EMS reports she is responsive and respond to pain in her right upper extremity.  She will say her name.  Later, daughter notes that this was a sudden change and at first she thought the patient just could not hear her but noticed that she was having strokelike symptoms.  Home Medications Prior to Admission medications   Medication Sig Start Date End Date Taking? Authorizing Provider  aspirin EC 81 MG tablet Take 81 mg by mouth daily.    [provider]  Cranberry 400 MG CAPS Take 400 mg by mouth daily.    [provider]  doxycycline (VIBRA-TABS) 100 MG tablet Take 1 tablet (100 mg) by mouth twice daily x 7 days 06/21/21   Furth, Cadence H, PA-C  empagliflozin (JARDIANCE) 10 MG TABS tablet Take 1 tablet (10 mg total) by mouth daily before breakfast. 04/06/22   Furth, Cadence H, PA-C  feeding supplement, ENSURE ENLIVE, (ENSURE ENLIVE) LIQD Take 237 mLs by mouth 2 (two) times daily between meals. Patient not taking: Reported on 06/21/2021 06/30/19   Enedina Finner, MD  furosemide (LASIX) 40 MG tablet 1 tablet by mouth twice a week 04/06/22   Furth, Cadence H, PA-C  losartan (COZAAR) 25 MG tablet Take 0.5 tablets (12.5 mg total) by mouth 2 (two) times daily. 04/06/22   Furth, Cadence H, PA-C  metoprolol succinate (TOPROL-XL) 25 MG 24 hr tablet Take 0.5 tablets (12.5 mg total) by mouth daily. 04/06/22   Furth, Cadence H, PA-C  Multiple Vitamin (MULTI-VITAMIN) tablet Take 1 tablet by mouth daily.     [provider]      Allergies    Patient has no known allergies.    Review of Systems   Review of Systems  Unable to perform ROS: Acuity of condition    Physical Exam Updated Vital Signs BP (!) 138/109   Pulse 90   Temp (!) 96.9 F (36.1 C)   Resp 19   Wt 41.2 kg   SpO2 100%   BMI 18.98 kg/m  Physical Exam Vitals and nursing note reviewed.  Constitutional:      Appearance: She is well-developed.  HENT:     Head: Normocephalic and atraumatic.  Cardiovascular:     Rate and Rhythm: Normal rate and regular rhythm.     Heart sounds: Normal heart sounds.  Pulmonary:     Effort: Pulmonary effort is normal.     Breath sounds: Normal breath sounds.  Abdominal:     Palpations: Abdomen is soft.     Tenderness: There is no abdominal tenderness.  Skin:    General: Skin is warm and dry.  Neurological:     Mental Status: She is lethargic.     Comments: Patient will squeeze right hand and spontaneously moves right leg.  A little bit of movement in the left lower extremity but not to command.  Flaccid left upper extremity.  Right-sided gaze preference.     ED Results / Procedures / Treatments   Labs (all labs ordered  are listed, but only abnormal results are displayed) Labs Reviewed  CBC - Abnormal; Notable for the following components:      Result Value   Platelets 146 (*)    All other components within normal limits  COMPREHENSIVE METABOLIC PANEL - Abnormal; Notable for the following components:   Calcium 8.2 (*)    Total Protein 5.6 (*)    Albumin 3.0 (*)    All other components within normal limits  I-STAT CHEM 8, ED - Abnormal; Notable for the following components:   Calcium, Ion 1.09 (*)    All other components within normal limits  PROTIME-INR  APTT  DIFFERENTIAL  ETHANOL  CBG MONITORING, ED    EKG EKG Interpretation  Date/Time:  Monday April 11 2022 20:41:55 EDT Ventricular Rate:  93 PR Interval:  197 QRS Duration: 152 QT Interval:  411 QTC  Calculation: 512 R Axis:   -66 Text Interpretation: Sinus or ectopic atrial rhythm Left bundle branch block Confirmed by Sherwood Gambler [phone]) on 04/04/2022 9:25:04 PM  Radiology CT CEREBRAL PERFUSION W CONTRAST  Result Date: 03/30/2022 CLINICAL DATA:  Right ICA occlusion EXAM: CT PERFUSION BRAIN TECHNIQUE: Multiphase CT imaging of the brain was performed following IV bolus contrast injection. Subsequent parametric perfusion maps were calculated using RAPID software. RADIATION DOSE REDUCTION: This exam was performed according to the departmental dose-optimization program which includes automated exposure control, adjustment of the mA and/or kV according to patient size and/or use of iterative reconstruction technique. CONTRAST:  62mL OMNIPAQUE IOHEXOL 350 MG/ML SOLN COMPARISON:  CT studies earlier same day FINDINGS: CT Brain Perfusion Findings: CBF (<30%) Volume: 34mL Perfusion (Tmax>6.0s) volume: 29mL Mismatch Volume: 16mL ASPECTS on noncontrast CT Head: 10 at 2010 today. Infarct Core: 40 mL Infarction Location:Frontal and parietal white matter on the right. Cingulate gyrus on the right. 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 T-max greater than 6 seconds is noted throughout the right anterior and middle cerebral artery territories, with profound delay in the upper half of the right hemisphere. Electronically Signed   By: Nelson Chimes M.D.   On: 03/17/2022 20:40   CT ANGIO HEAD NECK W WO CM (CODE STROKE)  Result Date: 03/29/2022 CLINICAL DATA:  Left-sided weakness.  Slurred speech. EXAM: CT ANGIOGRAPHY HEAD AND NECK TECHNIQUE: Multidetector CT imaging of the head and neck was performed using the standard protocol  during bolus administration of intravenous contrast. Multiplanar CT image reconstructions and MIPs were obtained to evaluate the vascular anatomy. Carotid stenosis measurements (when applicable) are obtained utilizing NASCET criteria, using the distal internal carotid diameter as the denominator. RADIATION DOSE REDUCTION: This exam was performed according to the departmental dose-optimization program which includes automated exposure control, adjustment of the mA and/or kV according to patient size and/or use of iterative reconstruction technique. CONTRAST:  21mL OMNIPAQUE IOHEXOL 350 MG/ML SOLN COMPARISON:  Head CT earlier same day. FINDINGS: CTA NECK FINDINGS Aortic arch: Aortic atherosclerosis. Branching pattern is normal. 30% stenosis of the proximal left subclavian artery. Soft plaque at the innominate artery origin but without gross ulceration. Right carotid system: Common carotid artery widely patent to the bifurcation. Carotid bifurcation is widely patent. Slow flow within the right internal carotid artery which is probably occluded at or near the skull  base. See below. Left carotid system: Common carotid artery tortuous but widely patent to the bifurcation. Carotid bifurcation shows mild soft plaque in the ICA bulb but no stenosis. Cervical ICA widely patent beyond that. Vertebral arteries: Right vertebral artery origin is widely patent. Calcified plaque at the left vertebral artery origin with 50% stenosis. Both vessels widely patent beyond that through the cervical region to the foramen magnum. Skeleton: Chronic spondylosis. Other neck: No neck mass or lymphadenopathy. Upper chest: Mild scarring at the lung apices. Review of the MIP images confirms the above findings CTA HEAD FINDINGS Anterior circulation: No flow is seen within the right ICA at the skull base level or through the carotid siphon. Left internal carotid artery widely patent through the skull base and siphon region. The left anterior and  middle cerebral vessels are widely patent. There is primary fetal origin of the left PCA from the anterior circulation. There is no reconstituted flow in the distal ICA or the M1 segment. Diminished but present flow is noted within the right anterior cerebral artery, probably due to a small patent anterior communicating artery. Reconstituted flow is present within the right middle cerebral artery superior division and to a lesser extent the inferior division. This appears to be primarily due to leptomeningeal collaterals. Posterior circulation: Posterior circulation branch vessels are patent. Venous sinuses: Patent Anatomic variants: None other significant. Review of the MIP images confirms the above findings IMPRESSION: Right ICA occlusion at the skull base. No reconstituted flow through the siphon or supraclinoid ICA. No reconstituted flow in the proximal right M1 segment. More distal right MCA branches do show a reconstituted flow, likely from leptomeningeal collaterals. There does not appear to be a patent posterior communicating artery on the right. I do think there is a patent anterior communicating artery which gives some supply to the right anterior cerebral artery territory. No carotid bifurcation stenosis. Case discussed with Dr. Melina Fiddler at a approximally 2025 hours Electronically Signed   By: Paulina Fusi M.D.   On: 04/04/2022 20:34   CT HEAD CODE STROKE WO CONTRAST  Result Date: 03/31/2022 CLINICAL DATA:  Code stroke. Neuro deficit, acute, stroke suspected. Left-sided weakness. Slurred speech. EXAM: CT HEAD WITHOUT CONTRAST TECHNIQUE: Contiguous axial images were obtained from the base of the skull through the vertex without intravenous contrast. RADIATION DOSE REDUCTION: This exam was performed according to the departmental dose-optimization program which includes automated exposure control, adjustment of the mA and/or kV according to patient size and/or use of iterative reconstruction technique.  COMPARISON:  None FINDINGS: Brain: Generalized atrophy. Old cortical and subcortical infarction in the left posterior temporal lobe and temporoparietal junction. Chronic small-vessel ischemic changes of the white matter. No sign of acute infarction, mass lesion, hemorrhage, hydrocephalus or extra-axial collection. Vascular: There is atherosclerotic calcification of the major vessels at the base of the brain. Skull: Normal Sinuses/Orbits: Clear/normal Other: None ASPECTS ([place] Stroke Program Early CT Score) - Ganglionic level infarction (caudate, lentiform nuclei, internal capsule, insula, M1-M3 cortex): 7 - Supraganglionic infarction (M4-M6 cortex): 3 Total score (0-10 with 10 being normal): 10 IMPRESSION: 1. No acute CT finding. Chronic small-vessel ischemic changes of the white matter. Old infarction of the left posterior temporal lobe and temporoparietal junction. 2. ASPECTS is 10. 3. These results were communicated to Dr. Derry Lory at 8:11 pm on 03/31/2022 by text page via the [facility] messaging system. Electronically Signed   By: Paulina Fusi M.D.   On: 04/09/2022 20:13    Procedures .Critical Care  Performed by:  Sherwood Gambler, MD Authorized by: Sherwood Gambler, MD   Critical care provider statement:    Critical care time (minutes):  45   Critical care time was exclusive of:  Separately billable procedures and treating other patients   Critical care was necessary to treat or prevent imminent or life-threatening deterioration of the following conditions:  CNS failure or compromise   Critical care was time spent personally by me on the following activities:  Development of treatment plan with patient or surrogate, discussions with consultants, evaluation of patient's response to treatment, examination of patient, ordering and review of laboratory studies, ordering and review of radiographic studies, ordering and performing treatments and interventions, pulse oximetry, re-evaluation of patient's  condition and review of old charts     Medications Ordered in ED Medications  sodium chloride flush (NS) 0.9 % injection 3 mL (3 mLs Intravenous Given 04/07/2022 2048)  tenecteplase (TNKASE) injection for Stroke 10 mg (10 mg Intravenous Given 04/01/2022 2010)  iohexol (OMNIPAQUE) 350 MG/ML injection 75 mL (75 mLs Intravenous Contrast Given 03/20/2022 2017)  iohexol (OMNIPAQUE) 350 MG/ML injection 40 mL (40 mLs Intravenous Contrast Given 03/27/2022 2026)    ED Course/ Medical Decision Making/ A&P                           Medical Decision Making Amount and/or Complexity of Data Reviewed Labs: ordered. Radiology: ordered.  Risk Prescription drug management. Decision regarding hospitalization.   Patient seems to be protecting airway on arrival.  She is also a DO NOT RESUSCITATE.  CT head images viewed and interpreted by myself and there is no head bleed.  TNK was given by neurology.  CTA shows quite proximal clot in the right carotid.  She got a CT perfusion and due to another code stroke getting IR treatment, she would need to be transferred to [facility].  Dr. Lorrin Goodell had to arrange this but IR would not accept her based on the core infarct of 40%.    Family now wishes to rather have her be comfort care given the extensive stroke.  Given this, neurology is asking for a medical admission.  Discussed with Dr. Flossie Buffy who will admit.        Final Clinical Impression(s) / ED Diagnoses Final diagnoses:  Occlusion of right carotid artery  Acute ischemic stroke [facility])    Rx / DC Orders ED Discharge Orders     None         Sherwood Gambler, MD 04/03/2022 2130

## 2022-04-11 NOTE — Progress Notes (Signed)
PHARMACIST CODE STROKE RESPONSE  Notified to mix TNK at 2008 by Dr. Derry Lory TNK preparation completed at 2009  TNK dose = 10 mg IV over 5 seconds.   Issues/delays encountered (if applicable):   Patricia Bradford 03/23/2022 8:12 PM

## 2022-04-12 ENCOUNTER — Other Ambulatory Visit: Payer: Self-pay

## 2022-04-12 ENCOUNTER — Encounter (HOSPITAL_COMMUNITY): Payer: Self-pay | Admitting: Family Medicine

## 2022-04-12 DIAGNOSIS — Z681 Body mass index (BMI) 19 or less, adult: Secondary | ICD-10-CM | POA: Diagnosis not present

## 2022-04-12 DIAGNOSIS — Z66 Do not resuscitate: Secondary | ICD-10-CM | POA: Diagnosis present

## 2022-04-12 DIAGNOSIS — I480 Paroxysmal atrial fibrillation: Secondary | ICD-10-CM

## 2022-04-12 DIAGNOSIS — I63131 Cerebral infarction due to embolism of right carotid artery: Secondary | ICD-10-CM | POA: Diagnosis present

## 2022-04-12 DIAGNOSIS — Z7982 Long term (current) use of aspirin: Secondary | ICD-10-CM | POA: Diagnosis not present

## 2022-04-12 DIAGNOSIS — M79601 Pain in right arm: Secondary | ICD-10-CM | POA: Diagnosis present

## 2022-04-12 DIAGNOSIS — I110 Hypertensive heart disease with heart failure: Secondary | ICD-10-CM | POA: Diagnosis present

## 2022-04-12 DIAGNOSIS — Z7984 Long term (current) use of oral hypoglycemic drugs: Secondary | ICD-10-CM | POA: Diagnosis not present

## 2022-04-12 DIAGNOSIS — Z800 Family history of malignant neoplasm of digestive organs: Secondary | ICD-10-CM | POA: Diagnosis not present

## 2022-04-12 DIAGNOSIS — Z515 Encounter for palliative care: Secondary | ICD-10-CM | POA: Diagnosis not present

## 2022-04-12 DIAGNOSIS — R29714 NIHSS score 14: Secondary | ICD-10-CM | POA: Diagnosis present

## 2022-04-12 DIAGNOSIS — Z79899 Other long term (current) drug therapy: Secondary | ICD-10-CM | POA: Diagnosis not present

## 2022-04-12 DIAGNOSIS — G8194 Hemiplegia, unspecified affecting left nondominant side: Secondary | ICD-10-CM | POA: Diagnosis present

## 2022-04-12 DIAGNOSIS — I5042 Chronic combined systolic (congestive) and diastolic (congestive) heart failure: Secondary | ICD-10-CM | POA: Diagnosis present

## 2022-04-12 DIAGNOSIS — R54 Age-related physical debility: Secondary | ICD-10-CM | POA: Diagnosis present

## 2022-04-12 DIAGNOSIS — I639 Cerebral infarction, unspecified: Secondary | ICD-10-CM | POA: Diagnosis present

## 2022-04-12 DIAGNOSIS — R4020 Unspecified coma: Secondary | ICD-10-CM | POA: Diagnosis present

## 2022-04-12 DIAGNOSIS — E43 Unspecified severe protein-calorie malnutrition: Secondary | ICD-10-CM | POA: Diagnosis present

## 2022-04-12 DIAGNOSIS — H538 Other visual disturbances: Secondary | ICD-10-CM | POA: Diagnosis present

## 2022-04-12 DIAGNOSIS — Z823 Family history of stroke: Secondary | ICD-10-CM | POA: Diagnosis not present

## 2022-04-12 DIAGNOSIS — R29810 Facial weakness: Secondary | ICD-10-CM | POA: Diagnosis present

## 2022-04-12 MED ORDER — GLYCOPYRROLATE 0.2 MG/ML IJ SOLN
0.4000 mg | INTRAMUSCULAR | Status: DC | PRN
  Administered 2022-04-12: 0.4 mg via INTRAVENOUS
  Filled 2022-04-12: qty 2

## 2022-04-12 MED ORDER — MORPHINE SULFATE (PF) 2 MG/ML IV SOLN
1.0000 mg | INTRAVENOUS | Status: DC
  Administered 2022-04-12 – 2022-04-13 (×6): 1 mg via INTRAVENOUS
  Filled 2022-04-12 (×6): qty 1

## 2022-04-12 MED ORDER — MORPHINE SULFATE (PF) 2 MG/ML IV SOLN: 1.0000 mg | INTRAVENOUS | Status: DC | PRN

## 2022-04-12 NOTE — Progress Notes (Signed)
  Progress Note   Patient: Patricia Bradford YIF:[id number] DOB: 08-06-1916 DOA: Apr 19, 2022     0 DOS: the patient was seen and examined on 04/12/2022 at 9:25AM      Brief hospital course: Patricia Bradford is a 88 y.o. F with sCHF EF 20-25%, pAF not on AC, and HTN who presented with acute left sided weakness, right gaze preference and facial droop.  In the ER, CT head showed right ICA occlusion and she was given tpA.  She was offered transfer for IR thrombectomy, but given poor prognosis for recovery to her prior level of function, decision was made to stay and pursue comfort measures.     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  She is in no obvious distress. -Full comfort cares - Morphine for pain, Ativan for anxiety or fear - Consult hospice          Subjective: Overnight the patient is unsafe.  She is now not opening her eyes, making no spontaneous movements.  Where she was sitting random words before, she is now averbal.     Physical Exam: Vitals:   04/19/2022 2330 04/12/22 0013 04/12/22 0045 04/12/22 0428  BP: 138/70 111/69  (!) 163/84  Pulse: 72 66  70  Resp: 17 18  16   Temp:  98.1 F (36.7 C)  (!) 97.5 F (36.4 C)  TempSrc:  Oral  Oral  SpO2: 93% 93%  100%  Weight:   41.1 kg   Height:   4\' 10"  (1.473 m)    Frail elderly adult female, lying in bed, eyes closed, mouth open, breathing regularly RRR, no murmurs that I can appreciate, no peripheral edema Respiratory rate shallow, no abnormal lung sounds Diffuse loss of subcutaneous muscle mass and fat Makes no spontaneous verbalizations,spontaneous movements, does not respond to stimuli  Data  Reviewed: Complete blood count, basic metabolic panel unremarkable Echocardiogram from 2022 shows EF 20 to 25% CT head shows large core right ICA infarct  Family Communication: Daughter and son at the bedside    Disposition: Status should be inpatient The aptietn was admitted with a large stroke and is now unresponsive.  Family are aware of the high likelihood of imminent passing.  Arrangments for end of life care are underway        Author: , MD 04/12/2022 9:36 AM  For on call review [email or web address].

## 2022-04-12 NOTE — Hospital Course (Addendum)
Patricia Bradford is a 88 y.o. F with sCHF EF 20-25%, pAF not on AC, and HTN who presented with acute left sided weakness, right gaze preference and facial droop.  In the ER, CT head showed right ICA occlusion and she was given tpA.  She was offered transfer for IR thrombectomy, but given poor prognosis for recovery to her prior level of function, decision was made to stay and pursue comfort measures.

## 2022-04-12 NOTE — Progress Notes (Signed)
This chaplain responded to PMT NP-Alicia consult for EOL spiritual care and prayer. The Pt. is sleeping comfortably surrounded by multiple generations of family.   The chaplain listened reflectively as the family participated in telling stories of longevity and love in the Pt. life. The shared stories supported each family member and focused on the Pt. faith and family. The chaplain learned the family plans to continue to share their memories.  The family accepted the chaplain's invitation for prayer and F/U spiritual care.  Chaplain Stephanie Acre [phone]

## 2022-04-12 NOTE — Consult Note (Signed)
                                                                                 Consultation Note Date: 04/12/2022   Patient Name: Patricia Bradford  DOB: 06/07/1916  MRN: [id number]  Age / Sex: 88 y.o., female  PCP: Jadali, Fayegh, MD Referring Physician: Danford, Christopher Bradford, *  Reason for Consultation: Terminal Care  HPI/Patient Profile: 88 y.o. female  with past medical history of heart failure, hypertension admitted on 03/16/2022 with stroke.   Clinical Assessment and Goals of Care: I met today at Patricia Bradford' bedside after reviewing records and discussing briefly with Dr. Danford. Daughter, Patricia Bradford, is at bedside along with her husband and daughter. Patricia Bradford is resting comfortably when I enter. Patricia Bradford is tearful and shares that her mother just recently had an episode of restlessness and she appeared to have pain and discomfort. Patricia Bradford is very clear that she does not desire for her mother to have any discomfort. Luckily she has responded very well to morphine and appears very comfortable at current time. I discussed with Patricia Bradford the option to provide some scheduled medication to try and prevent further episodes of discomfort and Patricia Bradford agrees that this is what she would like her mother to have at this time. Patricia Bradford also expresses that she felt her mother may have discomfort from oxygen cannula as this has caused her discomfort on past admissions. I discussed the option to remove oxygen as I do not believe this is causing her any comfort and the medication for her pain and comfort will also help with any shortness of breath she may experience. Patricia Bradford requests that we remove oxygen at the end of my visit.   Patricia Bradford is resting comfortably with regular, even breathes although becoming more shallow. Feet are cold to touch with some mottling in feet. Her mental status has decreased significantly overnight per family. Ultimately I do not believe that pursuing hospice placement would be in her best interest.  Prognosis poor and likely hospital death over the next 24-48 hours. Family are relieved that we do not have to move her at this time.   Patricia Bradford was reportedly beginning to make a sandwich at time of her stroke. She was standing with her cane when Patricia Bradford recognized that she was not responding and noticed a facial droop. She did not fall but they lowered her into a chair and called EMS. They report that she did two word puzzles just hours before her stroke. She was very functional and fairly independent getting around with assistance from cane. She has commented to family about not knowing why she was still alive and not wanting to be bed-bound like her mother was after a stroke. They know that she would not desire life prolonging measures in her state and would not be happy with lower quality of life.   All questions/concerns addressed. Emotional support provided.   Primary Decision Maker NEXT OF KIN daughter Patricia Bradford    SUMMARY OF RECOMMENDATIONS   - DNR - Comfort care - Anticipate hospital death  Code Status/Advance Care Planning: DNR   Symptom Management:  Morphine 1 mg every 4 hours scheduled and   every hour PRN.  PRN medication available for anxiety, nausea, secretions.   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 The following aspects are pertinent.  Past Medical History:   Diagnosis Date   Chronic systolic congestive heart failure (HCC)    HTN (hypertension)    Social History   Socioeconomic History   Marital status: Widowed    Spouse name: Not on file   Number of children: Not on file   Years of education: Not on file   Highest education level: Not on file  Occupational History   Not on file  Tobacco Use   Smoking status: Never   Smokeless tobacco: Never  Vaping Use   Vaping Use: Never used  Substance and Sexual Activity   Alcohol use: Never   Drug use: Never   Sexual activity: Not on file  Other Topics Concern   Not on file  Social History Narrative   Not on file   Social Determinants of Health   Financial Resource Strain: Not on file  Food Insecurity: Not on file  Transportation Needs: Not on file  Physical Activity: Not on file  Stress: Not on file  Social Connections: Not on file   Family History  Problem Relation Age of Onset   Colon cancer Sister    Scheduled Meds:   morphine injection  1 mg Intravenous Q4H   Continuous Infusions: PRN Meds:.acetaminophen **OR** acetaminophen, haloperidol **OR** haloperidol **OR** haloperidol lactate, LORazepam, morphine injection, ondansetron **OR** ondansetron (ZOFRAN) IV No Known Allergies Review of Systems  Unable to perform ROS: Acuity of condition    Physical Exam Vitals and nursing note reviewed.  Constitutional:      General: She is not in acute distress.    Appearance: She is ill-appearing.     Comments: Thin, frail   Cardiovascular:     Rate and Rhythm: Normal rate.  Pulmonary:     Effort: No tachypnea, accessory muscle usage or respiratory distress.     Comments: Shallow  Abdominal:     General: Abdomen is flat.     Palpations: Abdomen is soft.  Neurological:     Mental Status: She is unresponsive.     Vital Signs: BP (!) 163/84 (BP Location: Right Arm)   Pulse 70   Temp (!) 97.5 F (36.4 C) (Oral)   Resp 16   Ht 4' 10" (1.473 m)   Wt 41.1 kg   SpO2 100%    BMI 18.94 kg/m  Pain Scale: Faces       SpO2: SpO2: 100 % O2 Device:SpO2: 100 % O2 Flow Rate: .O2 Flow Rate (L/min): 2 L/min  IO: Intake/output summary:  Intake/Output Summary (Last 24 hours) at 04/12/2022 1337 Last data filed at 04/12/2022 0900 Gross per 24 hour  Intake 0 ml  Output --  Net 0 ml    LBM:   Baseline Weight: Weight: 41.2 kg Most recent weight: Weight: 41.1 kg     Palliative Assessment/Data:     Time In: 1000  Time Total: 60  min  Greater than 50%  of this time was spent counseling and coordinating care related to the above assessment and plan.  Signed by: Vinie Sill, NP Palliative Medicine Team Pager # [phone] (M-F 8a-5p) Team Phone # [phone] (Nights/Weekends)

## 2022-04-12 DEATH — deceased

## 2022-04-13 DIAGNOSIS — I63131 Cerebral infarction due to embolism of right carotid artery: Secondary | ICD-10-CM | POA: Diagnosis not present

## 2022-04-13 DIAGNOSIS — Z515 Encounter for palliative care: Secondary | ICD-10-CM | POA: Diagnosis not present

## 2022-04-13 MED ORDER — MORPHINE SULFATE (PF) 2 MG/ML IV SOLN
2.0000 mg | INTRAVENOUS | Status: DC | PRN
  Administered 2022-04-15: 2 mg via INTRAVENOUS
  Filled 2022-04-13: qty 1

## 2022-04-13 MED ORDER — GLYCOPYRROLATE 0.2 MG/ML IJ SOLN
0.4000 mg | INTRAMUSCULAR | Status: DC
  Administered 2022-04-13 – 2022-04-15 (×15): 0.4 mg via INTRAVENOUS
  Filled 2022-04-13 (×16): qty 2

## 2022-04-13 MED ORDER — LORAZEPAM 2 MG/ML IJ SOLN
1.0000 mg | Freq: Two times a day (BID) | INTRAMUSCULAR | Status: DC
  Administered 2022-04-13 – 2022-04-15 (×6): 1 mg via INTRAVENOUS
  Filled 2022-04-13 (×6): qty 1

## 2022-04-13 MED ORDER — DIPHENHYDRAMINE HCL 50 MG/ML IJ SOLN: 12.5000 mg | Freq: Four times a day (QID) | INTRAMUSCULAR | Status: DC | PRN

## 2022-04-13 MED ORDER — MORPHINE SULFATE (PF) 2 MG/ML IV SOLN
2.0000 mg | INTRAVENOUS | Status: DC
  Administered 2022-04-13 – 2022-04-15 (×15): 2 mg via INTRAVENOUS
  Filled 2022-04-13 (×15): qty 1

## 2022-04-13 NOTE — Progress Notes (Signed)
Palliative:  HPI: 88 y.o. female  with past medical history of heart failure, hypertension admitted on 03/18/2022 with stroke.    I met today at Patricia Bradford' bedside along with daughter Manuela Schwartz and son-in-law. Patricia Bradford is resting mostly comfortably. She has deeper, sighing like breathes. Facial muscles more withdrawn. She withdraws to even slight touch to foot. Her face does appear a little tense/grimaced and not completely relaxed. Manuela Schwartz reports some signs of concern that she could be having some discomfort. Manuela Schwartz shares that her mother has been reaching up and holding her head and rubbing her head - she is fearful that her mother is feeling discomfort. We discussed plan to provide improved comfort for Patricia Bradford. We discussed increased dosage of morphine on same 4 hour schedule with as needed doses in between. We also discussed addition of scheduled medication for anxiety as well as medication available for breakthrough pain. I discussed with Manuela Schwartz option of morphine infusion if these measures due not provide comfort for her mother. We discussed that morphine infusion can be initiated at any time if needed. Manuela Schwartz agrees to begin with increased scheduled doses and proceed with morphine infusion if ineffective.   All questions/concerns addressed. Emotional support provided.   Exam: Unresponsive. Pale. Withdrawn facial muscles. Regular breathes. Abd soft.   Plan: - Morphine 2 mg every 4 hours and every hours PRN pain/SOB. IF INEFFECTIVE: Morphine 1-2 mg/hr (max dose 10 mg/hr; increase basal rate by 50% if requiring > 2 bolus in 1 hour with bolus via infusion of 1-2 mg every 15 min PRN).  - Ativan 1 mg every 12 hours scheduled and every 4 hours PRN anxiety.  - PRN benadryl itching.  - Robinul every 4 hours scheduled for secretion management.  - Anticipate hospital death.   [place], NP Palliative Medicine Team Pager [phone] (Please see [email or web address] for schedule) Team Phone  [phone]    Greater than 50%  of this time was spent counseling and coordinating care related to the above assessment and plan

## 2022-04-13 NOTE — Progress Notes (Signed)
Patient Patricia Bradford      DOB: [birth year]      PFX:[id number]      Palliative Medicine Team    Subjective: Bedside symptom check completed. Three family members bedside at time of visit.   Physical exam: Patient resting in bed with eyes closed at time of visit. Breathing even and non-labored on room air, no excessive secretions noted. Patient without physical or non-verbal signs of pain or discomfort at this time. Daughter endorses that the patient has been less fidgety, more comfortable appearing since adjusting medications this morning.    Assessment and plan: Daughter without additional needs or concerns this morning. This RN provided ongoing education surrounding scheduled vs PRN medications for comfort, family understanding. Will continue to follow for any changes or advances.    Thank you for allowing the Palliative Medicine Team to assist in the care of this patient.     Shelda Jakes, MSN, RN Palliative Medicine Team Team Phone: [phone]  This phone is monitored 7a-7p, please reach out to attending physician outside of these hours for urgent needs.

## 2022-04-13 NOTE — Plan of Care (Signed)

## 2022-04-13 NOTE — Progress Notes (Signed)
PROGRESS NOTE  Patricia Bradford  [id number] DOB: 06/02/1916 DOA: 03/19/2022 PCP: Casilda Carls, MD   Brief Narrative:  Patient is a 88 year old female with history of systolic CHF with EF of 20 to 25%, paroxysmal A-fib not on anticoagulation, hypertension who presented with acute left-sided weakness, right gaze preference, facial droop.  On presentation, CT head showed right ICA occlusion and was given tPA.  She was offered transfer for IR thrombectomy, but given poor prognosis for recovery to her prior level of function, decision was made to stay and pursue comfort measures.  Palliative care involved.  Currently on full comfort care.    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  On presentation, CT head showed right ICA occlusion and was given tPA.  She was offered transfer for IR thrombectomy, but given poor prognosis for recovery to her prior level of function, decision was made to stay and pursue comfort measures.  Other medical problems:  Paroxysmal A-fib Severe protein calorie malnutrition Essential hypertension Combined systolic/diastolic and diastolic heart failure  Goals of care: Currently on full comfort care.  Palliative care also involved.  Anticipate hospital death.      DVT prophylaxis:  None     Code Status: DNR  Family Communication: Family at bedside  Patient status: Inpatient  Patient is from : Home  Anticipated discharge to: Anticipate hospital death    Consultants: Palliative care, neurology  Procedures: None  Antimicrobials:   Anti-infectives (From admission, onward)    None       Subjective:  Patient seen and examined at the bedside this morning.  On full comfort care.  She appears very comfortable, not in any kind of distress.    Objective: Vitals:   04/12/22 0013 04/12/22 0045 04/12/22 0428 04/13/22 0133  BP: 111/69  (!) 163/84 119/62  Pulse: 66  70 93  Resp: 18  16 16   Temp: 98.1 F (36.7 C)  (!) 97.5 F (36.4 C) (!) 97.4 F (36.3 C)  TempSrc: Oral  Oral Axillary  SpO2: 93%  100% 98%  Weight:  41.1 kg    Height:  4\' 10"  (1.473 m)      Intake/Output Summary (Last 24 hours) at 04/13/2022 0855 Last data filed at 04/12/2022 1900 Gross per 24 hour  Intake 0 ml  Output --  Net 0 ml   Filed Weights   03/31/2022 2000 04/12/22 0045  Weight: 41.2 kg 41.1 kg    Examination:  General exam: Overall comfortable, not in distress Respiratory system:  no wheezes or crackles  Cardiovascular system: S1 & S2 heard, RRR.  Gastrointestinal system: Abdomen is nondistended, soft and nontender. Central nervous system: In deep sleep Extremities: No edema, no clubbing ,no cyanosis Skin: No rashes, no ulcers,no icterus     Data Reviewed: I have personally reviewed following labs and imaging studies  CBC: Recent Labs  Lab 03/28/2022 2040 03/15/2022 2046  WBC 6.5  --   NEUTROABS 3.4  --   HGB 12.0 12.2  HCT 38.3 36.0  MCV 97.0  --   PLT 146*  --    Basic Metabolic Panel: Recent Labs  Lab 04/01/2022  2040 14-Apr-2022 2046  NA 135 135  K 4.7 4.6  CL 103 100  CO2 27  --   GLUCOSE 99 88  BUN 17 19  CREATININE 0.70 0.60  CALCIUM 8.2*  --      No results found for this or any previous visit (from the past 240 hour(s)).   Radiology Studies: CT CEREBRAL PERFUSION W CONTRAST  Result Date: 14-Apr-2022 CLINICAL DATA:  Right ICA occlusion EXAM: CT PERFUSION BRAIN TECHNIQUE: Multiphase CT imaging of the brain was performed following IV bolus contrast injection. Subsequent parametric perfusion maps were  calculated using RAPID software. RADIATION DOSE REDUCTION: This exam was performed according to the departmental dose-optimization program which includes automated exposure control, adjustment of the mA and/or kV according to patient size and/or use of iterative reconstruction technique. CONTRAST:  51mL OMNIPAQUE IOHEXOL 350 MG/ML SOLN COMPARISON:  CT studies earlier same day FINDINGS: CT Brain Perfusion Findings: CBF (<30%) Volume: 30mL Perfusion (Tmax>6.0s) volume: Mismatch Volume: ASPECTS on noncontrast CT Head: 10 at 2010 today. Infarct Core: 40 mL Infarction Location:Frontal and parietal white matter on the right. Cingulate gyrus on the right. 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 T-max greater than 6 seconds is noted throughout the right anterior and middle cerebral artery territories, with profound delay in the upper half of the right hemisphere. Electronically Signed   By: Paulina Fusi M.D.   On: 04-14-2022 20:40   CT ANGIO HEAD NECK W WO CM (CODE STROKE)  Result Date: Apr 14, 2022 CLINICAL DATA:  Left-sided weakness.  Slurred speech. EXAM: CT ANGIOGRAPHY HEAD AND NECK TECHNIQUE: Multidetector CT imaging of the head and neck was performed using the standard protocol during bolus administration of intravenous contrast. Multiplanar CT image reconstructions and MIPs were obtained to evaluate the vascular anatomy. Carotid stenosis measurements (when applicable) are obtained utilizing NASCET criteria, using the distal internal carotid diameter as the denominator. RADIATION DOSE REDUCTION: This exam was performed according to the departmental dose-optimization program which includes automated exposure control,  adjustment of the mA and/or kV according to patient size and/or use of iterative reconstruction technique. CONTRAST:  15mL OMNIPAQUE IOHEXOL 350 MG/ML SOLN COMPARISON:  Head CT earlier same day. FINDINGS: CTA NECK FINDINGS Aortic arch: Aortic atherosclerosis. Branching pattern is normal. 30% stenosis of the proximal left subclavian artery. Soft plaque at the innominate artery origin but without gross ulceration. Right carotid system: Common carotid artery widely patent to the bifurcation. Carotid bifurcation is widely patent. Slow flow within the right internal carotid artery which is probably occluded at or near the skull base. See below. Left carotid system: Common carotid artery tortuous but widely patent to the bifurcation. Carotid bifurcation shows mild soft plaque in the ICA bulb but no stenosis. Cervical ICA widely patent beyond that. Vertebral arteries: Right vertebral artery origin is widely patent. Calcified plaque at the left vertebral artery origin with 50% stenosis. Both vessels widely patent beyond that through the cervical region to the foramen magnum. Skeleton: Chronic spondylosis. Other neck: No neck mass or lymphadenopathy. Upper chest: Mild scarring at the lung apices. Review of the MIP images confirms the above findings CTA HEAD FINDINGS Anterior circulation: No flow is seen within the right ICA at the skull base level or through  the carotid siphon. Left internal carotid artery widely patent through the skull base and siphon region. The left anterior and middle cerebral vessels are widely patent. There is primary fetal origin of the left PCA from the anterior circulation. There is no reconstituted flow in the distal ICA or the M1 segment. Diminished but present flow is noted within the right anterior cerebral artery, probably due to a small patent anterior communicating artery. Reconstituted flow is present within the right middle cerebral artery superior division and to a lesser extent the  inferior division. This appears to be primarily due to leptomeningeal collaterals. Posterior circulation: Posterior circulation branch vessels are patent. Venous sinuses: Patent Anatomic variants: None other significant. Review of the MIP images confirms the above findings IMPRESSION: Right ICA occlusion at the skull base. No reconstituted flow through the siphon or supraclinoid ICA. No reconstituted flow in the proximal right M1 segment. More distal right MCA branches do show a reconstituted flow, likely from leptomeningeal collaterals. There does not appear to be a patent posterior communicating artery on the right. I do think there is a patent anterior communicating artery which gives some supply to the right anterior cerebral artery territory. No carotid bifurcation stenosis. Case discussed with Dr. Melina Fiddler at a approximally 2025 hours Electronically Signed   By: Paulina Fusi M.D.   On: 04/04/2022 20:34   CT HEAD CODE STROKE WO CONTRAST  Result Date: 03/16/2022 CLINICAL DATA:  Code stroke. Neuro deficit, acute, stroke suspected. Left-sided weakness. Slurred speech. EXAM: CT HEAD WITHOUT CONTRAST TECHNIQUE: Contiguous axial images were obtained from the base of the skull through the vertex without intravenous contrast. RADIATION DOSE REDUCTION: This exam was performed according to the departmental dose-optimization program which includes automated exposure control, adjustment of the mA and/or kV according to patient size and/or use of iterative reconstruction technique. COMPARISON:  None FINDINGS: Brain: Generalized atrophy. Old cortical and subcortical infarction in the left posterior temporal lobe and temporoparietal junction. Chronic small-vessel ischemic changes of the white matter. No sign of acute infarction, mass lesion, hemorrhage, hydrocephalus or extra-axial collection. Vascular: There is atherosclerotic calcification of the major vessels at the base of the brain. Skull: Normal Sinuses/Orbits:  Clear/normal Other: None ASPECTS (Alberta Stroke Program Early CT Score) - Ganglionic level infarction (caudate, lentiform nuclei, internal capsule, insula, M1-M3 cortex): 7 - Supraganglionic infarction (M4-M6 cortex): 3 Total score (0-10 with 10 being normal): 10 IMPRESSION: 1. No acute CT finding. Chronic small-vessel ischemic changes of the white matter. Old infarction of the left posterior temporal lobe and temporoparietal junction. 2. ASPECTS is 10. 3. These results were communicated to Dr. Derry Lory at 8:11 pm on 03/31/2022 by text page via the [facility] messaging system. Electronically Signed   By: Paulina Fusi M.D.   On: 03/14/2022 20:13    Scheduled Meds:   morphine injection  1 mg Intravenous Q4H   Continuous Infusions:   LOS: 1 day   Burnadette Pop, MD [facility] P8/10/2021, 8:55 AM

## 2022-04-14 ENCOUNTER — Encounter (HOSPITAL_COMMUNITY): Payer: Self-pay | Admitting: Family Medicine

## 2022-04-14 DIAGNOSIS — I63131 Cerebral infarction due to embolism of right carotid artery: Secondary | ICD-10-CM | POA: Diagnosis not present

## 2022-04-14 NOTE — Progress Notes (Signed)
PROGRESS NOTE  Patricia Bradford  [id number]:[id number] DOB: Sep 23, 1915 DOA: 01-May-2022 PCP: Sherrie Mustache, MD   Brief Narrative:  Patient is a 88 year old female with history of systolic CHF with EF of 20 to 81%, paroxysmal A-fib not on anticoagulation, hypertension who presented with acute left-sided weakness, right gaze preference, facial droop.  On presentation, CT head showed right ICA occlusion and was given tPA.  She was offered transfer for IR thrombectomy, but given poor prognosis for recovery to her prior level of function, decision was made to stay and pursue comfort measures.  Palliative care involved.  Currently on full comfort care.    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  On presentation, CT head showed right ICA occlusion and was given tPA.  She was offered transfer for IR thrombectomy, but given poor prognosis for recovery to her prior level of function, decision was made to stay and pursue comfort measures.  Other medical problems:  Paroxysmal A-fib Severe protein calorie malnutrition Essential hypertension Combined systolic/diastolic and diastolic heart failure  Goals of care: Currently on full comfort care.  Palliative care also involved.  Anticipate hospital death.      DVT prophylaxis:  None     Code Status: DNR  Family Communication: Family at bedside  Patient status: Inpatient  Patient is from : Home  Anticipated discharge to: Anticipate hospital death    Consultants: Palliative care, neurology  Procedures: None  Antimicrobials:   Anti-infectives (From admission, onward)    None       Subjective:  Patient seen and examined at the bedside this mrng, Looks comfortable,not in distress, deep sleep   Objective: Vitals:   04/12/22 0045 04/12/22 0428 04/13/22 0133 04/14/22 0452  BP:  (!) 163/84 119/62 124/66  Pulse:  70 93 95  Resp:  16 16 20   Temp:  (!) 97.5 F (36.4 C) (!) 97.4 F (36.3 C) 98.6 F (37 C)  TempSrc:  Oral Axillary Oral  SpO2:  100% 98% 95%  Weight: 41.1 kg     Height: 4\' 10"  (1.473 m)       Intake/Output Summary (Last 24 hours) at 04/14/2022 1124 Last data filed at 04/13/2022 2300 Gross per 24 hour  Intake 0 ml  Output 150 ml  Net -150 ml   Filed Weights   05-01-22 2000 04/12/22 0045  Weight: 41.2 kg 41.1 kg    Examination:  General exam: Comfortable, on full comfort care, not in distress, comatose  Data Reviewed: I have personally reviewed following labs and imaging studies  CBC: Recent Labs  Lab 05-01-2022 2040 May 01, 2022 2046  WBC 6.5  --   NEUTROABS 3.4  --   HGB 12.0 12.2  HCT 38.3 36.0  MCV 97.0  --   PLT 146*  --    Basic Metabolic Panel: Recent Labs  Lab 05-01-2022 2040 05/01/2022 2046  NA 135 135  K 4.7 4.6  CL 103 100  CO2 27  --   GLUCOSE 99 88  BUN 17 19  CREATININE 0.70 0.60  CALCIUM 8.2*  --      No results found for this or any previous visit (  from the past 240 hour(s)).   Radiology Studies: No results found.  Scheduled Meds:  glycopyrrolate  0.4 mg Intravenous Q4H   LORazepam  1 mg Intravenous Q12H    morphine injection  2 mg Intravenous Q4H   Continuous Infusions:   LOS: 2 days   Burnadette Pop, MD [facility] P8/11/2021, 11:24 AM

## 2022-04-14 NOTE — Plan of Care (Signed)
  Problem: Education: To the family Goal: Knowledge of General Education information will improve Description: Including pain rating scale, medication(s)/side effects and non-pharmacologic comfort measures Outcome: Progressing

## 2022-04-14 NOTE — Progress Notes (Signed)
Patient BM:WUXLK E Maggard      DOB: [birth year]      GMW:[id number]      Palliative Medicine Team    Subjective: Bedside symptom check completed. Daughter bedside at time of visit.   Physical exam: Patient resting in bed with eyes closed at time of visit. Breathing even and non-labored on room air, no excessive secretions noted. Patient without physical or non-verbal signs of pain or discomfort at this time.    Assessment and plan: Family endorses that patient has remained comfortable on current medications. Daughter has no needs or concerns today. Patient with less interaction/purposeful movement on assessment today, anticipating nearing EOL. Plan to remain in hospital for death and provide in house support. Will continue to follow for any changes or advances.    Thank you for allowing the Palliative Medicine Team to assist in the care of this patient.     Shelda Jakes, MSN, RN Palliative Medicine Team Team Phone: [phone]  This phone is monitored 7a-7p, please reach out to attending physician outside of these hours for urgent needs.

## 2022-04-15 DIAGNOSIS — I63131 Cerebral infarction due to embolism of right carotid artery: Secondary | ICD-10-CM | POA: Diagnosis not present

## 2022-05-13 NOTE — Consult Note (Addendum)
   [facility] CM Inpatient Consult   29-Apr-2022  ANNIKAH LOVINS 09-23-1915 [id number]  [facility] [[facility]]  Accountable Care Organization [ACO] Patient: Medicare ACO REACH  Primary Care Provider:  Sherrie Mustache, MD with [facility] per office staff member  *Provider not listed on [facility] roster  1:47 pm Rounding   Patient screened for hospitalization with noted on [facility] banner. Progress notes shows now for comfort measure .Review of patient's medical record reveals patient is currently under full comfort measure.   Plan:  Will sign off.    For questions contact:   Charlesetta Shanks, RN BSN CCM [facility]  [phone] business mobile phone Toll free office [phone]  Fax number: [phone] [email or web address]

## 2022-05-13 NOTE — Plan of Care (Signed)
  Problem: Education: Goal: Knowledge of General Education information will improve Description: Including pain rating scale, medication(s)/side effects and non-pharmacologic comfort measures Outcome: Not Progressing   Problem: Health Behavior/Discharge Planning: Goal: Ability to manage health-related needs will improve Outcome: Not Progressing   Problem: Clinical Measurements: Goal: Ability to maintain clinical measurements within normal limits will improve Outcome: Not Progressing Goal: Will remain free from infection Outcome: Not Progressing Goal: Diagnostic test results will improve Outcome: Not Progressing Goal: Respiratory complications will improve Outcome: Not Progressing Goal: Cardiovascular complication will be avoided Outcome: Not Progressing   Problem: Activity: Goal: Risk for activity intolerance will decrease Outcome: Not Progressing   Problem: Nutrition: Goal: Adequate nutrition will be maintained Outcome: Not Progressing   Problem: Coping: Goal: Level of anxiety will decrease Outcome: Not Progressing   Problem: Elimination: Goal: Will not experience complications related to bowel motility Outcome: Not Progressing Goal: Will not experience complications related to urinary retention Outcome: Not Progressing   Problem: Pain Managment: Goal: General experience of comfort will improve Outcome: Not Progressing   Problem: Safety: Goal: Ability to remain free from injury will improve Outcome: Not Progressing   Problem: Skin Integrity: Goal: Risk for impaired skin integrity will decrease Outcome: Not Progressing  Patient is on comfort measures only. Continues to appear comfortable and resting at this time.

## 2022-05-13 NOTE — Progress Notes (Signed)
PROGRESS NOTE  MEGAHN KILLINGS  ZJI:[id number] DOB: [birth year] DOA: 07-May-2022 PCP: Sherrie Mustache, MD   Brief Narrative:  Patient is a 88 year old female with history of systolic CHF with EF of 20 to 17%, paroxysmal A-fib not on anticoagulation, hypertension who presented with acute left-sided weakness, right gaze preference, facial droop.  On presentation, CT head showed right ICA occlusion and was given tPA.  She was offered transfer for IR thrombectomy, but given poor prognosis for recovery to her prior level of function, decision was made to stay and pursue comfort measures.  Palliative care involved.  Currently on full comfort care.    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  On presentation, CT head showed right ICA occlusion and was given tPA.  She was offered transfer for IR thrombectomy, but given poor prognosis for recovery to her prior level of function, decision was made to stay and pursue comfort measures.  Other medical problems:  Paroxysmal A-fib Severe protein calorie malnutrition Essential hypertension Combined systolic/diastolic and diastolic heart failure  Goals of care: Currently on full comfort care.  Palliative care also following.  Anticipate hospital death.      DVT prophylaxis:  None     Code Status: DNR  Family Communication: Family at bedside  Patient status: Inpatient  Patient is from : Home  Anticipated discharge to: Anticipate hospital death    Consultants: Palliative care, neurology  Procedures: None  Antimicrobials:   Anti-infectives (From admission, onward)    None       Subjective:  Patient seen and examined at the bedside this morning.  Family at bedside.  She continues to remain comatose, unresponsive.  Appears comfortable, not in distress.  Objective: Vitals:   04/12/22 0428 04/13/22 0133 04/14/22 0452 05/08/2022 0528  BP: (!) 163/84 119/62 124/66 132/65  Pulse: 70 93 95 (!) 109  Resp: 16 16 20 18   Temp: (!) 97.5 F (36.4 C) (!) 97.4 F (36.3 C) 98.6 F (37 C) 99.8 F (37.7 C)  TempSrc: Oral Axillary Oral Oral  SpO2: 100% 98% 95% (!) 85%  Weight:      Height:       No intake or output data in the 24 hours ending 05/05/2022 1029  Filed Weights   2022/05/07 2000 04/12/22 0045  Weight: 41.2 kg 41.1 kg    Examination:  General exam: Comfortable, on full comfort care, not in distress, comatose, Cheyne-Stokes breathing  Data Reviewed: I have personally reviewed following labs and imaging studies  CBC: Recent Labs  Lab 2022-05-07 2040 05/07/2022 2046  WBC 6.5  --   NEUTROABS 3.4  --   HGB 12.0 12.2  HCT 38.3 36.0  MCV 97.0  --   PLT 146*  --    Basic Metabolic Panel: Recent Labs  Lab 05/07/2022 2040 May 07, 2022 2046  NA 135 135  K 4.7 4.6  CL 103 100  CO2 27  --   GLUCOSE 99 88  BUN 17 19  CREATININE 0.70 0.60  CALCIUM 8.2*  --      No results found for this or any previous visit (from the past 240 hour(s)).  Radiology Studies: No results found.  Scheduled Meds:  glycopyrrolate  0.4 mg Intravenous Q4H   LORazepam  1 mg Intravenous Q12H    morphine injection  2 mg Intravenous Q4H   Continuous Infusions:   LOS: 3 days   Burnadette Pop, MD [facility] P8/12/2021, 10:29 AM

## 2022-05-13 NOTE — Care Management Important Message (Signed)
Important Message  Patient Details  Name: Patricia Bradford MRN: [id number] Date of Birth: [birth year]   Medicare Important Message Given:  No     Sherilyn Banker 10-May-2022, 12:13 PM

## 2022-05-13 NOTE — Progress Notes (Signed)
8/4 Patient under comfort care with expected death, IM Letter respectfully not given.

## 2022-05-13 NOTE — Progress Notes (Signed)
Patient WE:RXVQM E Mollenkopf      DOB: 07-07-1916      GQQ:[id number]      Palliative Medicine Team    Subjective: Bedside symptom check completed. Son in law bedside at time of visit.    Physical exam: Patient resting in bed with eyes closed at time of visit. Open mouthed breathing irregular, non-labored, no excessive secretions noted. Patient without physical or non-verbal signs of pain or discomfort at this time. Patient unresponsive to verbal or tactile stimulation. Extremities warm to touch bilaterally, unable to palpate distal pulses. Increasing discoloration and settling ecchymosis noted in all extremities.    Assessment and plan: This RN touched base with bedside RN Vernona Rieger, without additional concerns or needs this morning. In agreement that patient is progressed today compared to assessment yesterday. She endorses that patient has looked very comfortable overall. PRN medication utilized with cleaning and rolling of patient. Anticipate nearing EOL, in hospital death expected. Son in law bedside also endorses change in condition, he shares experience of both his parents passing in similar fashion, he has no needs or concerns at this time. Will continue to follow for any changes or advances and support family as able.    Thank you for allowing the Palliative Medicine Team to assist in the care of this patient.     Shelda Jakes, MSN, RN Palliative Medicine Team Team Phone: [phone]  This phone is monitored 7a-7p, please reach out to attending physician outside of these hours for urgent needs.

## 2022-05-13 NOTE — Death Summary Note (Signed)
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  On presentation, CT head showed right ICA occlusion and was given tPA.  She was offered transfer for IR thrombectomy, but given poor prognosis for recovery to her prior level of function, decision was made to stay and pursue comfort measures.  Palliative care involved.  Initiated full comfort care.She expired on 15-May-2023.     The results of significant diagnostics from this hospitalization (including imaging, microbiology, ancillary and laboratory) are listed below for reference.   Significant Diagnostic Studies: CT CEREBRAL PERFUSION W CONTRAST  Result Date: May 10, 2022 CLINICAL DATA:  Right ICA occlusion EXAM: CT PERFUSION BRAIN TECHNIQUE: Multiphase CT imaging of the brain was performed following IV bolus contrast injection. Subsequent parametric perfusion maps were calculated using RAPID software. RADIATION DOSE REDUCTION: This exam was performed according to the departmental dose-optimization program which includes automated exposure control, adjustment of the mA and/or kV according to patient size and/or use of iterative reconstruction technique. CONTRAST:   55mL OMNIPAQUE IOHEXOL 350 MG/ML SOLN COMPARISON:  CT studies earlier same day FINDINGS: CT Brain Perfusion Findings: CBF (<30%) Volume: 54mL Perfusion (Tmax>6.0s) volume: Mismatch Volume: ASPECTS on noncontrast CT Head: 10 at Jan 14, 2009 today. Infarct Core: 40 mL Infarction Location:Frontal and parietal white matter on the right. Cingulate gyrus on the right. 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 T-max greater than 6 seconds is noted throughout the right anterior and middle cerebral artery territories, with profound delay in the upper half of the right hemisphere. Electronically Signed   By: Paulina Fusi M.D.   On: May 10, 2022 20:40   CT ANGIO HEAD NECK W WO CM (CODE STROKE)  Result Date: 05/10/2022 CLINICAL DATA:  Left-sided weakness.  Slurred speech. EXAM: CT ANGIOGRAPHY HEAD AND NECK TECHNIQUE: Multidetector CT imaging of the head and neck was performed using the standard protocol during bolus administration of intravenous contrast. Multiplanar CT image reconstructions and MIPs were obtained to evaluate the vascular anatomy. Carotid stenosis measurements (when applicable) are obtained utilizing NASCET criteria, using the distal internal carotid diameter as the denominator. RADIATION DOSE REDUCTION: This exam was performed according to the departmental dose-optimization program which includes automated exposure control, adjustment of the mA and/or kV according to patient size and/or use of iterative reconstruction technique. CONTRAST:  47mL OMNIPAQUE IOHEXOL 350 MG/ML SOLN COMPARISON:  Head CT earlier same day. FINDINGS: CTA NECK FINDINGS Aortic arch: Aortic atherosclerosis. Branching pattern is normal. 30% stenosis of  the proximal left subclavian artery. Soft plaque at  the innominate artery origin but without gross ulceration. Right carotid system: Common carotid artery widely patent to the bifurcation. Carotid bifurcation is widely patent. Slow flow within the right internal carotid artery which is probably occluded at or near the skull base. See below. Left carotid system: Common carotid artery tortuous but widely patent to the bifurcation. Carotid bifurcation shows mild soft plaque in the ICA bulb but no stenosis. Cervical ICA widely patent beyond that. Vertebral arteries: Right vertebral artery origin is widely patent. Calcified plaque at the left vertebral artery origin with 50% stenosis. Both vessels widely patent beyond that through the cervical region to the foramen magnum. Skeleton: Chronic spondylosis. Other neck: No neck mass or lymphadenopathy. Upper chest: Mild scarring at the lung apices. Review of the MIP images confirms the above findings CTA HEAD FINDINGS Anterior circulation: No flow is seen within the right ICA at the skull base level or through the carotid siphon. Left internal carotid artery widely patent through the skull base and siphon region. The left anterior and middle cerebral vessels are widely patent. There is primary fetal origin of the left PCA from the anterior circulation. There is no reconstituted flow in the distal ICA or the M1 segment. Diminished but present flow is noted within the right anterior cerebral artery, probably due to a small patent anterior communicating artery. Reconstituted flow is present within the right middle cerebral artery superior division and to a lesser extent the inferior division. This appears to be primarily due to leptomeningeal collaterals. Posterior circulation: Posterior circulation branch vessels are patent. Venous sinuses: Patent Anatomic variants: None other significant. Review of the MIP images confirms the above findings IMPRESSION: Right ICA occlusion at  the skull base. No reconstituted flow through the siphon or supraclinoid ICA. No reconstituted flow in the proximal right M1 segment. More distal right MCA branches do show a reconstituted flow, likely from leptomeningeal collaterals. There does not appear to be a patent posterior communicating artery on the right. I do think there is a patent anterior communicating artery which gives some supply to the right anterior cerebral artery territory. No carotid bifurcation stenosis. Case discussed with Dr. Melina Fiddler at a approximally 2025 hours Electronically Signed   By: Paulina Fusi M.D.   On: 04/04/2022 20:34   CT HEAD CODE STROKE WO CONTRAST  Result Date: 04/03/2022 CLINICAL DATA:  Code stroke. Neuro deficit, acute, stroke suspected. Left-sided weakness. Slurred speech. EXAM: CT HEAD WITHOUT CONTRAST TECHNIQUE: Contiguous axial images were obtained from the base of the skull through the vertex without intravenous contrast. RADIATION DOSE REDUCTION: This exam was performed according to the departmental dose-optimization program which includes automated exposure control, adjustment of the mA and/or kV according to patient size and/or use of iterative reconstruction technique. COMPARISON:  None FINDINGS: Brain: Generalized atrophy. Old cortical and subcortical infarction in the left posterior temporal lobe and temporoparietal junction. Chronic small-vessel ischemic changes of the white matter. No sign of acute infarction, mass lesion, hemorrhage, hydrocephalus or extra-axial collection. Vascular: There is atherosclerotic calcification of the major vessels at the base of the brain. Skull: Normal Sinuses/Orbits: Clear/normal Other: None ASPECTS (Alberta Stroke Program Early CT Score) - Ganglionic level infarction (caudate, lentiform nuclei, internal capsule, insula, M1-M3 cortex): 7 - Supraganglionic infarction (M4-M6 cortex): 3 Total score (0-10 with 10 being normal): 10 IMPRESSION: 1. No acute CT finding. Chronic  small-vessel ischemic changes of the white matter. Old infarction of the left posterior temporal lobe and temporoparietal junction. 2. ASPECTS is 10. 3. These results  were communicated to Dr. Lorrin Goodell at 8:11 pm on 04/06/2022 by text page via the [facility] messaging system. Electronically Signed   By: Nelson Chimes M.D.   On: 03/30/2022 20:13    Microbiology: No results found for this or any previous visit (from the past 240 hour(s)).  Time spent: 15 minutes  Signed: Shelly Coss, MD

## 2022-05-13 DEATH — deceased

## 2023-03-03 IMAGING — DX DG CHEST 1V PORT
1 series · 1 of 1 positions shown · non-contrast
Comparison: 06/05/2021

CLINICAL DATA: Fever.

EXAM:
PORTABLE CHEST 1 VIEW

[chest ap]
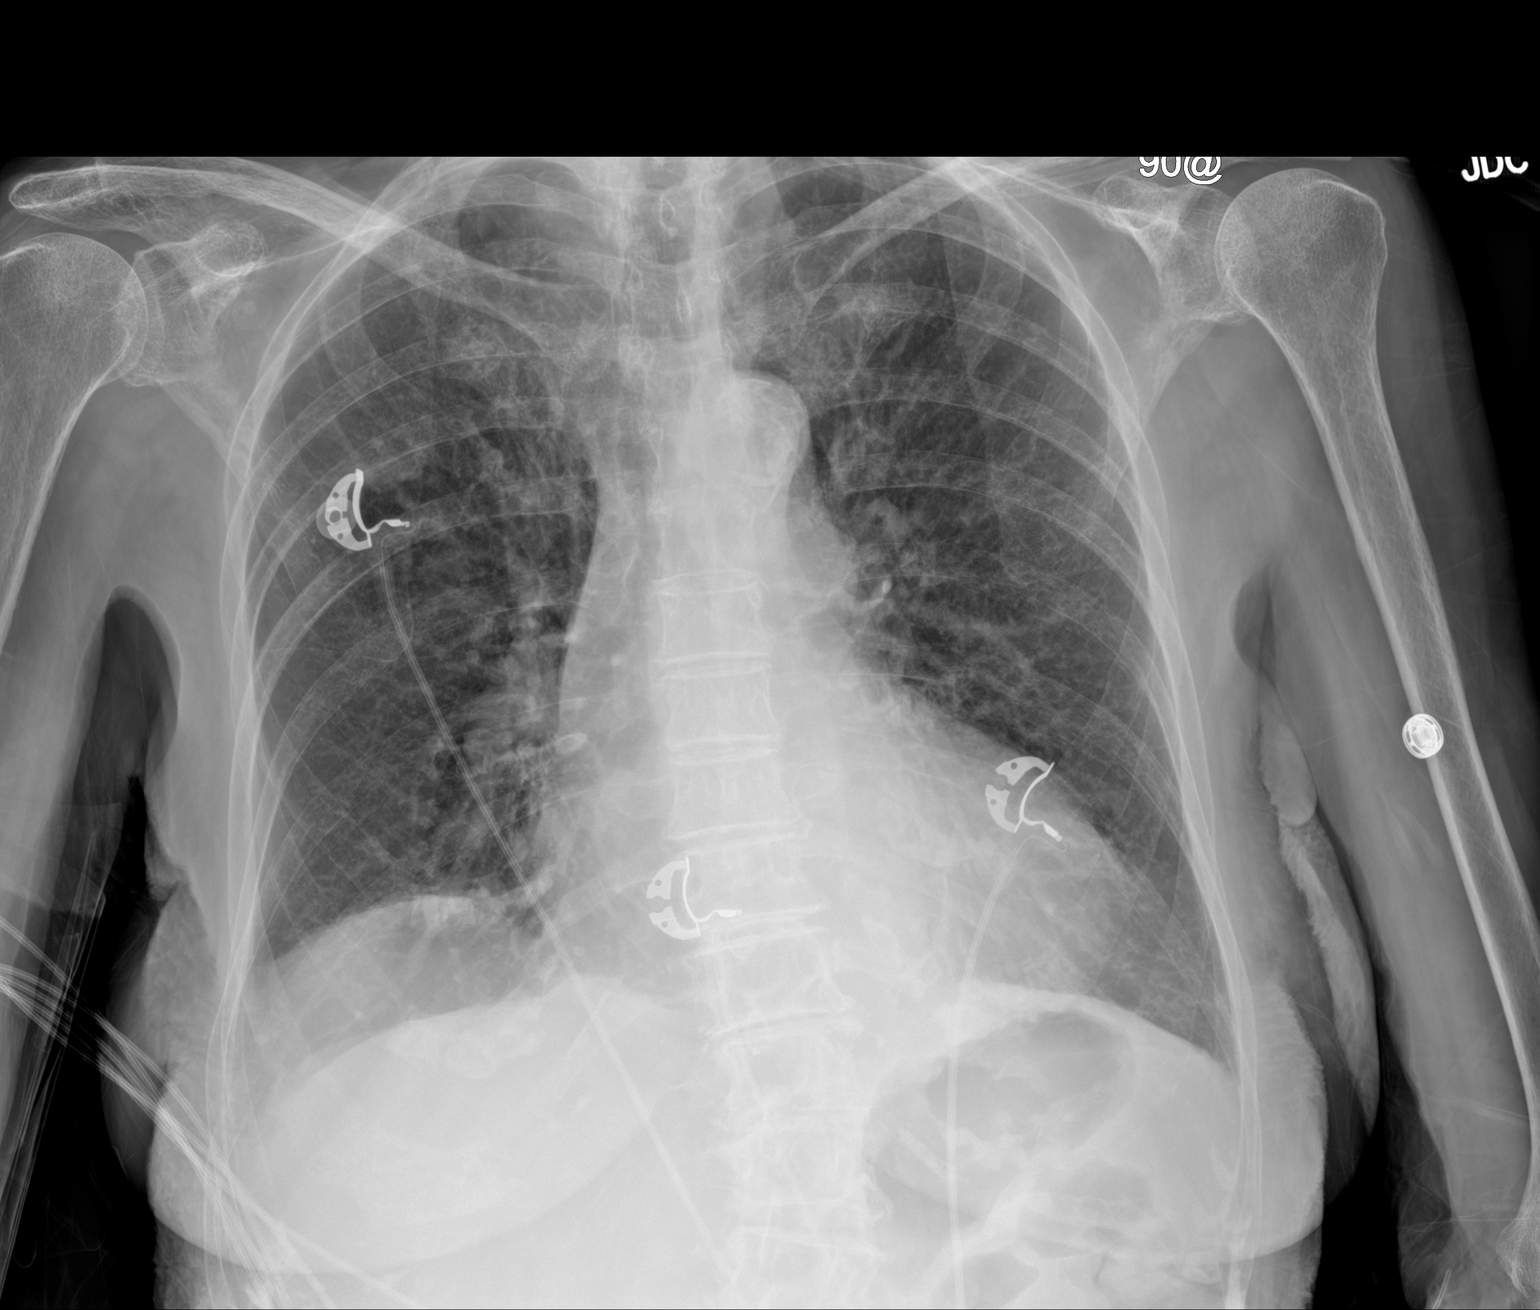

[1 of 1 positions shown; findings below may reference images not displayed]

FINDINGS: Coarse lung markings are unchanged. No focal airspace disease. No
overt pulmonary edema. Heart and mediastinum are within normal
limits. Atherosclerotic calcifications at the aortic arch. Negative
for a pneumothorax.
IMPRESSION: No active disease.
